# Patient Record
Sex: Female | Born: 1985 | Race: White | Hispanic: No | Marital: Married | Smoking: Current every day smoker
Health system: Southern US, Community
[De-identification: ages and names within clinical notes are randomized; demographics above are authoritative.]

## PROBLEM LIST (undated history)

## (undated) DIAGNOSIS — Z789 Other specified health status: Secondary | ICD-10-CM

## (undated) HISTORY — DX: Other specified health status: Z78.9

## (undated) HISTORY — PX: NO PAST SURGERIES: SHX2092

---

## 2010-05-14 ENCOUNTER — Inpatient Hospital Stay (HOSPITAL_COMMUNITY): Admission: AD | Admit: 2010-05-14 | Discharge: 2010-05-15 | Payer: Self-pay | Admitting: Obstetrics and Gynecology

## 2010-05-15 ENCOUNTER — Inpatient Hospital Stay (HOSPITAL_COMMUNITY): Admission: AD | Admit: 2010-05-15 | Discharge: 2010-05-15 | Payer: Self-pay | Admitting: Obstetrics and Gynecology

## 2010-05-20 ENCOUNTER — Inpatient Hospital Stay (HOSPITAL_COMMUNITY): Admission: AD | Admit: 2010-05-20 | Discharge: 2010-05-20 | Payer: Self-pay | Admitting: Obstetrics and Gynecology

## 2010-05-20 ENCOUNTER — Ambulatory Visit: Payer: Self-pay | Admitting: Obstetrics and Gynecology

## 2010-05-23 ENCOUNTER — Inpatient Hospital Stay (HOSPITAL_COMMUNITY): Admission: AD | Admit: 2010-05-23 | Discharge: 2010-05-23 | Payer: Self-pay | Admitting: Obstetrics and Gynecology

## 2010-06-05 ENCOUNTER — Inpatient Hospital Stay (HOSPITAL_COMMUNITY): Admission: RE | Admit: 2010-06-05 | Discharge: 2010-06-07 | Payer: Self-pay | Admitting: Obstetrics and Gynecology

## 2010-12-04 LAB — COMPREHENSIVE METABOLIC PANEL
ALT: 8 U/L (ref 0–35)
AST: 15 U/L (ref 0–37)
AST: 16 U/L (ref 0–37)
Alkaline Phosphatase: 133 U/L — ABNORMAL HIGH (ref 39–117)
Alkaline Phosphatase: 112 U/L (ref 39–117)
CO2: 20 mEq/L (ref 19–32)
CO2: 20 mEq/L (ref 19–32)
Calcium: 8.6 mg/dL (ref 8.4–10.5)
Chloride: 107 mEq/L (ref 96–112)
Creatinine, Ser: 0.5 mg/dL (ref 0.4–1.2)
GFR calc Af Amer: >60 mL/min (ref >60)
GFR calc non Af Amer: >60 mL/min (ref >60)
Glucose, Bld: 86 mg/dL (ref 70–99)
Potassium: 3.5 mEq/L (ref 3.5–5.1)
Sodium: 133 mEq/L — ABNORMAL LOW (ref 135–145)
Total Bilirubin: 0.6 mg/dL (ref 0.3–1.2)
Total Bilirubin: 0.5 mg/dL (ref 0.3–1.2)
Total Protein: 6 g/dL (ref 6.0–8.3)
Total Protein: 5.5 g/dL — ABNORMAL LOW (ref 6.0–8.3)

## 2010-12-04 LAB — CBC
HCT: 28.9 % — ABNORMAL LOW (ref 36.0–46.0)
HCT: 30.3 % — ABNORMAL LOW (ref 36.0–46.0)
Hemoglobin: 10.1 g/dL — ABNORMAL LOW (ref 12.0–15.0)
Hemoglobin: 10.1 g/dL — ABNORMAL LOW (ref 12.0–15.0)
MCH: 32.1 pg (ref 26.0–34.0)
MCH: 32.9 pg (ref 26.0–34.0)
MCV: 92.5 fL (ref 78.0–100.0)
MCV: 93.2 fL (ref 78.0–100.0)
RDW: 13 % (ref 11.5–15.5)
WBC: 16.5 10*3/uL — ABNORMAL HIGH (ref 4.0–10.5)
WBC: 8.9 10*3/uL (ref 4.0–10.5)

## 2010-12-04 LAB — URINALYSIS, ROUTINE W REFLEX MICROSCOPIC
Glucose, UA: NEGATIVE mg/dL
Protein, ur: NEGATIVE mg/dL
pH: 6.5 (ref 5.0–8.0)

## 2010-12-04 LAB — URINE MICROSCOPIC-ADD ON

## 2010-12-04 LAB — URIC ACID: Uric Acid, Serum: 5.3 mg/dL (ref 2.4–7.0)

## 2010-12-05 LAB — URINALYSIS, ROUTINE W REFLEX MICROSCOPIC
Glucose, UA: NEGATIVE mg/dL
Ketones, ur: NEGATIVE mg/dL
Nitrite: NEGATIVE
Protein, ur: NEGATIVE mg/dL
Specific Gravity, Urine: 1.02 (ref 1.005–1.030)
Urobilinogen, UA: 0.2 mg/dL (ref 0.0–1.0)

## 2015-06-10 ENCOUNTER — Encounter: Payer: Self-pay | Admitting: Women's Health

## 2015-06-10 ENCOUNTER — Ambulatory Visit (INDEPENDENT_AMBULATORY_CARE_PROVIDER_SITE_OTHER): Payer: BLUE CROSS/BLUE SHIELD | Admitting: Women's Health

## 2015-06-10 VITALS — Ht 59.0 in | Wt 150.0 lb

## 2015-06-10 DIAGNOSIS — Z30430 Encounter for insertion of intrauterine contraceptive device: Secondary | ICD-10-CM | POA: Diagnosis not present

## 2015-06-10 DIAGNOSIS — Z30432 Encounter for removal of intrauterine contraceptive device: Secondary | ICD-10-CM | POA: Diagnosis not present

## 2015-06-10 DIAGNOSIS — Z3009 Encounter for other general counseling and advice on contraception: Secondary | ICD-10-CM

## 2015-06-10 NOTE — Progress Notes (Signed)
Julie Morton is a 29 y.o. year old G58P2002 Caucasian female who presents for removal and replacement of a Mirena IUD. She was given informed consent for removal and reinsertion of her Mirena. Her Mirena was placed 06/2010 after the birth of her last child, Patient's last menstrual period was 05/20/2015.   The risks and benefits of the method and placement have been thouroughly reviewed with the patient and all questions were answered.  Specifically the patient is aware of failure rate of 09/998, expulsion of the IUD and of possible perforation.  The patient is aware of irregular bleeding due to the method and understands the incidence of irregular bleeding diminishes with time.  Signed copy of informed consent in chart.   Patient's last menstrual period was 05/20/2015. Ht  (1.499 m)  Wt 150 lb (68.04 kg)  BMI 30.28 kg/m2  LMP 05/20/2015 No results found for this or any previous visit (from the past 24 hour(s)).   Appropriate time out taken. A graves speculum was placed in the vagina.  The cervix was visualized, prepped using Betadine. The strings were visible. They were grasped and the Mirena was easily removed. The cervix was then grasped with a single-tooth tenaculum. The uterus was found to be neutral and it sounded to 7 cm. The dilators were easily passed through the cervical os, but the Mirena IUD was difficult to pass through the os, however it finally did and pt experienced a lot of pain, there was blood flashback in the IUD chamber and flange was at 8cm- so it was felt that the uterus was perforated, so IUD was not inserted and insertion device was withdrawn.   Discussed w/ pt- gave option of returning while on period for IUD insertion. Pt unsure she wants another IUD at this time. Discussed nexplanon as another LARC method, as well as all other contraception options. Pt wants to think about it and will let us know.  Recommended abstinence until new contraception initiated.     Marge Duncans CNM, [facility] 06/10/2015 11:06 AM

## 2015-06-10 NOTE — Patient Instructions (Signed)
Do not have sex until you decided what type of birth control you want

## 2015-06-12 ENCOUNTER — Telehealth: Payer: Self-pay | Admitting: *Deleted

## 2015-06-12 ENCOUNTER — Other Ambulatory Visit: Payer: Self-pay | Admitting: Women's Health

## 2015-06-12 MED ORDER — CIPROFLOXACIN HCL 500 MG PO TABS: 500.0000 mg | ORAL_TABLET | Freq: Two times a day (BID) | ORAL | Status: DC

## 2015-06-12 NOTE — Telephone Encounter (Signed)
Informed pt Julie Morton has sent in a medication to her pharmacy and wants to recheck her on Monday, informed if symptoms worsen to call back or go to ED.  Pt was not wanting to wait until next week at first, she was more concerned with her symptoms today and stated she was unsure if she even wanted to come back here at all after all the pain she had and states we scared her because she yelled for Kim to stop and she kept going.  Discussed with Dr. Despina Hidden and he said there is nothing to need check today, take the medication and recheck next week.  After several minute talking to the pt she agreed to return on Monday and call was transferred to front staff for an appointment to be made.  Pt also states that her husband has made an appointment to have a vasectomy so she is not interested in getting any form of birth control any more.

## 2015-06-12 NOTE — Progress Notes (Signed)
Pt called and spoke w/ nurse, nausea, cramping, fever/chills today. Had IUD removal and unsuccessful reinsertion/possible uterine perforation on Monday 9/19. Discussed w/ [facility], doesn't need to be seen today, rx cipro  BID x 10d, come in Monday for f/u. To let us know/go to ED for worsening sx.  Cheral Marker, CNM, [facility] 06/12/2015 2:46 PM

## 2015-06-12 NOTE — Telephone Encounter (Signed)
Pt states was here on Monday for IUD removal and reinsertion, removal was successful but the reinsertion was not.  Pt states she was told she would bleed for a couple of days but her bleeding is worse today than it has been and she is having lower abdominal cramping, nausea and fever off and on all day today.  She is concerned that this could be related to the trouble getting a new IUD placed.  Please advise.

## 2015-06-14 ENCOUNTER — Telehealth: Payer: Self-pay | Admitting: Women's Health

## 2015-06-14 NOTE — Telephone Encounter (Signed)
Called to check on pt. States she is feeling much better. Did not go get antibiotics d/t working, etc. No longer having any fever/chills, cramping, vb. Does not feel that she needs to keep appt on Monday for f/u. To call us if she needs anything.  Cheral Marker, CNM, [facility] 06/14/2015 1:03 PM

## 2015-06-17 ENCOUNTER — Ambulatory Visit: Payer: BLUE CROSS/BLUE SHIELD | Admitting: Women's Health

## 2015-10-25 ENCOUNTER — Encounter: Payer: Self-pay | Admitting: Family Medicine

## 2015-10-25 ENCOUNTER — Ambulatory Visit (INDEPENDENT_AMBULATORY_CARE_PROVIDER_SITE_OTHER): Payer: BLUE CROSS/BLUE SHIELD | Admitting: Family Medicine

## 2015-10-25 VITALS — BP 117/84 | HR 107 | Temp 98.3°F | Ht 59.0 in | Wt 154.4 lb

## 2015-10-25 DIAGNOSIS — J029 Acute pharyngitis, unspecified: Secondary | ICD-10-CM | POA: Diagnosis not present

## 2015-10-25 LAB — POCT RAPID STREP A (OFFICE): RAPID STREP A SCREEN: NEGATIVE

## 2015-10-25 NOTE — Progress Notes (Signed)
   Subjective:    Patient ID: Julie Morton, female    DOB: March 27, 1986, 30 y.o.   MRN: [id number]  HPI 30 year old female who is a new patient to the practice. Her complaints are sore throat and left ear pain. She denies fever. She has had strep in the past. Symptoms started yesterday.  There are no active problems to display for this patient.  Outpatient Encounter Prescriptions as of 10/25/2015  Medication Sig  . [DISCONTINUED] ciprofloxacin (CIPRO) 500 MG tablet Take 1 tablet (500 mg total) by mouth 2 (two) times daily. X 10days  . [DISCONTINUED] levonorgestrel (MIRENA) 20 MCG/24HR IUD 1 each by Intrauterine route once.   No facility-administered encounter medications on file as of 10/25/2015.      Review of Systems  Constitutional: Negative.   HENT: Positive for ear pain and sore throat.   Respiratory: Negative.   Cardiovascular: Negative.        Objective:   Physical Exam  Constitutional: She appears well-developed and well-nourished.  HENT:  Mouth/Throat: Oropharynx is clear and moist. No oropharyngeal exudate.  Left TM is slightly dull compared to right  Pulmonary/Chest: Effort normal.          Assessment & Plan:  1. Sore throat Rapid strep test is negative. Clinical exam would support this. There is no significant adenopathy no fever no headache. The patient was told this is likely a virus she seemed to not want to accept this because I told her treatment is symptomatic with anti-inflammatory salt water gargles. She asked about contagious illness and we discussed this several times. For her ear I would recommended Valsalva maneuver (ear exercise) and a 2 or 3 day course of Afrin twice a day.  Frederica Kuster MD - POCT rapid strep A

## 2016-06-04 LAB — OB RESULTS CONSOLE HIV ANTIBODY (ROUTINE TESTING): HIV: NONREACTIVE

## 2016-06-04 LAB — OB RESULTS CONSOLE RPR: RPR: NONREACTIVE

## 2016-06-04 LAB — OB RESULTS CONSOLE RUBELLA ANTIBODY, IGM: RUBELLA: NON-IMMUNE/NOT IMMUNE

## 2016-06-04 LAB — OB RESULTS CONSOLE ABO/RH: RH TYPE: POSITIVE

## 2016-06-04 LAB — OB RESULTS CONSOLE ANTIBODY SCREEN: ANTIBODY SCREEN: NEGATIVE

## 2016-06-04 LAB — OB RESULTS CONSOLE GC/CHLAMYDIA
Chlamydia: NEGATIVE
Gonorrhea: NEGATIVE

## 2016-06-04 LAB — OB RESULTS CONSOLE HEPATITIS B SURFACE ANTIGEN: HEP B S AG: NEGATIVE

## 2016-06-15 ENCOUNTER — Emergency Department (HOSPITAL_COMMUNITY)
Admission: EM | Admit: 2016-06-15 | Discharge: 2016-06-15 | Disposition: A | Discharge status: Home / Self Care | Payer: BLUE CROSS/BLUE SHIELD | Attending: Emergency Medicine | Admitting: Emergency Medicine

## 2016-06-15 ENCOUNTER — Encounter (HOSPITAL_COMMUNITY): Payer: Self-pay | Admitting: *Deleted

## 2016-06-15 DIAGNOSIS — R51 Headache: Secondary | ICD-10-CM | POA: Diagnosis not present

## 2016-06-15 DIAGNOSIS — O99511 Diseases of the respiratory system complicating pregnancy, first trimester: Secondary | ICD-10-CM | POA: Diagnosis present

## 2016-06-15 DIAGNOSIS — F17210 Nicotine dependence, cigarettes, uncomplicated: Secondary | ICD-10-CM | POA: Diagnosis not present

## 2016-06-15 DIAGNOSIS — Z79899 Other long term (current) drug therapy: Secondary | ICD-10-CM | POA: Insufficient documentation

## 2016-06-15 DIAGNOSIS — O99331 Smoking (tobacco) complicating pregnancy, first trimester: Secondary | ICD-10-CM | POA: Diagnosis not present

## 2016-06-15 DIAGNOSIS — Z3A11 11 weeks gestation of pregnancy: Secondary | ICD-10-CM | POA: Insufficient documentation

## 2016-06-15 DIAGNOSIS — J40 Bronchitis, not specified as acute or chronic: Secondary | ICD-10-CM | POA: Diagnosis not present

## 2016-06-15 DIAGNOSIS — R109 Unspecified abdominal pain: Secondary | ICD-10-CM | POA: Diagnosis not present

## 2016-06-15 MED ORDER — ACETAMINOPHEN 500 MG PO TABS
1000.0000 mg | ORAL_TABLET | Freq: Once | ORAL | Status: AC
  Administered 2016-06-15: 1000 mg via ORAL
  Filled 2016-06-15: qty 2

## 2016-06-15 MED ORDER — AZITHROMYCIN 250 MG PO TABS: 250.0000 mg | ORAL_TABLET | Freq: Every day | ORAL | 0 refills | Status: DC

## 2016-06-15 MED ORDER — ALBUTEROL SULFATE HFA 108 (90 BASE) MCG/ACT IN AERS
2.0000 | INHALATION_SPRAY | RESPIRATORY_TRACT | Status: DC | PRN
  Administered 2016-06-15: 2 via RESPIRATORY_TRACT
  Filled 2016-06-15: qty 6.7

## 2016-06-15 NOTE — ED Notes (Signed)
MD at bedside. 

## 2016-06-15 NOTE — ED Provider Notes (Signed)
[facility]-EMERGENCY DEPT Provider Note   CSN: [id number] Arrival date & time: 06/15/16  2108  By signing my name below, I, Alyssa GroveMartin Green, attest that this documentation has been prepared under the direction and in the presence of Azalia BilisKevin Lorianna Spadaccini, MD. Electronically Signed: Alyssa GroveMartin Green, ED Scribe. 06/15/16. 11:29 PM.   History   Chief Complaint Chief Complaint  Patient presents with  . URI   The history is provided by the patient. No language interpreter was used.    HPI Comments: Julie Morton is a 30 y.o. female who presents to the Emergency Department complaining of gradual onset, constant, nonproductive cough onset 6 days PTA. She is currently [redacted] weeks pregnant and has had US to confirm. She states this is her 4th child. Pt reports associated nasal congestion, chest congestion, chest tightness, fevers, and headaches. She states she experiences left sided abdominal pain pain when she coughs. Pt is a smoker, but has she has been tapering off due to pregnancy. Denies shortness of breath. Pt is allergic to Amoxicillin and Penicillin.   History reviewed. No pertinent past medical history.  There are no active problems to display for this patient.   History reviewed. No pertinent surgical history.  OB History    Gravida Para Term Preterm AB Living   3 2 2     2    SAB TAB Ectopic Multiple Live Births           2       Home Medications    Prior to Admission medications   Not on File    Family History Family History  Problem Relation Age of Onset  . Diabetes Father     Social History Social History  Substance Use Topics  . Smoking status: Current Every Day Smoker    Packs/day: 0.50    Years: 10.00    Types: Cigarettes  . Smokeless tobacco: Never Used  . Alcohol use No     Allergies   Amoxicillin and Penicillins   Review of Systems Review of Systems A complete 10 system review of systems was obtained and all systems are negative except as noted in the HPI and PMH.      Physical Exam Updated Vital Signs BP 156/98 (BP Location: Left Arm)   Pulse 102   Temp 98.6 F (37 C) (Oral)   Resp 18   Ht 4\' 11"  (1.499 m)   Wt 157 lb (71.2 kg)   LMP 03/29/2016   SpO2 99%   BMI 31.71 kg/m   Physical Exam  Constitutional: She is oriented to person, place, and time. She appears well-developed and well-nourished. No distress.  HENT:  Head: Normocephalic and atraumatic.  Eyes: EOM are normal.  Neck: Normal range of motion.  Cardiovascular: Normal rate, regular rhythm and normal heart sounds.   Pulmonary/Chest: Effort normal and breath sounds normal.  Abdominal: Soft. She exhibits no distension. There is no tenderness.  Musculoskeletal: Normal range of motion.  Neurological: She is alert and oriented to person, place, and time.  Skin: Skin is warm and dry.  Psychiatric: She has a normal mood and affect. Judgment normal.  Nursing note and vitals reviewed.   ED Treatments / Results  DIAGNOSTIC STUDIES: Oxygen Saturation is 99% on RA, normal by my interpretation.    COORDINATION OF CARE: 11:26 PM Discussed treatment plan with pt at bedside which includes Albuterol and Azithromycin and pt agreed to plan.  Labs (all labs ordered are listed, but only abnormal results are displayed) Labs Reviewed -  No data to display  EKG  EKG Interpretation None       Radiology No results found.  Procedures Procedures (including critical care time)  Medications Ordered in ED Medications - No data to display   Initial Impression / Assessment and Plan / ED Course  I have reviewed the triage vital signs and the nursing notes.  Pertinent labs & imaging results that were available during my care of the patient were reviewed by me and considered in my medical decision making (see chart for details).  Clinical Course   I personally performed the services described in this documentation, which was scribed in my presence. The recorded information has been  reviewed and is accurate.       Pt is well appearing. Suspect bronchitis. Will cover with azithromycin. Home with albuterol to help with cough. No hypoxia or shortness of breath  Final Clinical Impressions(s) / ED Diagnoses   Final diagnoses:  Bronchitis    New Prescriptions New Prescriptions   No medications on file     Azalia Bilis, MD 06/15/16 2337

## 2016-06-15 NOTE — ED Triage Notes (Signed)
Pt reports nasal congestion, chest congestion. Pain on the left torso when she coughs. Intermittent fevers. Symptoms for 6 days. Pt reports [redacted] weeks pregnant.

## 2016-06-18 ENCOUNTER — Ambulatory Visit (INDEPENDENT_AMBULATORY_CARE_PROVIDER_SITE_OTHER): Payer: BLUE CROSS/BLUE SHIELD | Admitting: Family Medicine

## 2016-06-18 ENCOUNTER — Encounter: Payer: Self-pay | Admitting: Family Medicine

## 2016-06-18 VITALS — BP 111/81 | HR 97 | Temp 98.0°F | Ht 59.0 in | Wt 156.0 lb

## 2016-06-18 DIAGNOSIS — J209 Acute bronchitis, unspecified: Secondary | ICD-10-CM | POA: Diagnosis not present

## 2016-06-18 MED ORDER — ALBUTEROL SULFATE (2.5 MG/3ML) 0.083% IN NEBU: 2.5000 mg | INHALATION_SOLUTION | Freq: Four times a day (QID) | RESPIRATORY_TRACT | 0 refills | Status: DC | PRN

## 2016-06-18 MED ORDER — ALBUTEROL SULFATE (2.5 MG/3ML) 0.083% IN NEBU
2.5000 mg | INHALATION_SOLUTION | RESPIRATORY_TRACT | Status: AC
  Administered 2016-06-18: 2.5 mg via RESPIRATORY_TRACT

## 2016-06-18 NOTE — Progress Notes (Signed)
   HPI  Patient presents today with persistent cough.  Patient explains that she has been sick for about 8 days now, she went to the emergency room last weekend and was treated for bronchitis with azithromycin and albuterol. She states that albuterol is not very helpful, she only feels marginally improved.  She [redacted] weeks pregnant.  She has penicillin and amoxicillin allergy.  She's tolerating food and fluids normally. She also has malaise.  PMH: Smoking status noted ROS: Per HPI  Objective: BP 111/81   Pulse 97   Temp 98 F (36.7 C) (Oral)   Ht 4\' 11"  (1.499 m)   Wt 156 lb (70.8 kg)   LMP 03/29/2016   BMI 31.51 kg/m  Gen: NAD, alert, cooperative with exam HEENT: NCAT, nares with erythema and swelling, oropharynx clear, TMs normal bilaterally CV: RRR, good S1/S2, no murmur Resp: CTABL, no wheezes, non-labored Ext: No edema, warm Neuro: Alert and oriented, No gross deficits  Assessment and plan:  # acute bronchitis Continue azithromycin Given the overall nebulizer in clinic with very good improvement of symptoms Prescribed nebulizer machine   Discussed risks/Benefits with albuterol and pregnancy.  Meds ordered this encounter  Medications  . DISCONTD: albuterol (PROVENTIL HFA;VENTOLIN HFA) 108 (90 Base) MCG/ACT inhaler    Sig: Inhale 2 puffs into the lungs every 6 (six) hours as needed for wheezing or shortness of breath.  Marland Kitchen. albuterol (PROVENTIL) (2.5 MG/3ML) 0.083% nebulizer solution 2.5 mg  . albuterol (PROVENTIL) (2.5 MG/3ML) 0.083% nebulizer solution    Sig: Take 3 mLs (2.5 mg total) by nebulization every 6 (six) hours as needed for wheezing or shortness of breath.    Dispense:  150 mL    Refill:  0    Murtis SinkSam Bradshaw, MD [facility] 06/18/2016, 5:48 PM

## 2016-06-18 NOTE — Patient Instructions (Addendum)
Great to meet you!  Use the albuterol as needed ( up to 4 times daily) for the next week or so   Acute Bronchitis Bronchitis is when the airways that extend from the windpipe into the lungs get red, puffy, and painful (inflamed). Bronchitis often causes thick spit (mucus) to develop. This leads to a cough. A cough is the most common symptom of bronchitis. In acute bronchitis, the condition usually begins suddenly and goes away over time (usually in 2 weeks). Smoking, allergies, and asthma can make bronchitis worse. Repeated episodes of bronchitis may cause more lung problems. HOME CARE  Rest.  Drink enough fluids to keep your pee (urine) clear or pale yellow (unless you need to limit fluids as told by your doctor).  Only take over-the-counter or prescription medicines as told by your doctor.  Avoid smoking and secondhand smoke. These can make bronchitis worse. If you are a smoker, think about using nicotine gum or skin patches. Quitting smoking will help your lungs heal faster.  Reduce the chance of getting bronchitis again by:  Washing your hands often.  Avoiding people with cold symptoms.  Trying not to touch your hands to your mouth, nose, or eyes.  Follow up with your doctor as told. GET HELP IF: Your symptoms do not improve after 1 week of treatment. Symptoms include:  Cough.  Fever.  Coughing up thick spit.  Body aches.  Chest congestion.  Chills.  Shortness of breath.  Sore throat. GET HELP RIGHT AWAY IF:   You have an increased fever.  You have chills.  You have severe shortness of breath.  You have bloody thick spit (sputum).  You throw up (vomit) often.  You lose too much body fluid (dehydration).  You have a severe headache.  You faint. MAKE SURE YOU:   Understand these instructions.  Will watch your condition.  Will get help right away if you are not doing well or get worse.   This information is not intended to replace advice given to  you by your health care provider. Make sure you discuss any questions you have with your health care provider.   Document Released: 02/24/2008 Document Revised: 05/10/2013 Document Reviewed: 02/28/2013 Elsevier Interactive Patient Education [employer]2016 Elsevier Inc.

## 2016-08-24 ENCOUNTER — Ambulatory Visit (INDEPENDENT_AMBULATORY_CARE_PROVIDER_SITE_OTHER): Payer: BLUE CROSS/BLUE SHIELD | Admitting: Physician Assistant

## 2016-08-24 ENCOUNTER — Encounter: Payer: Self-pay | Admitting: Physician Assistant

## 2016-08-24 VITALS — BP 120/79 | HR 105 | Temp 97.9°F | Ht 59.0 in | Wt 166.0 lb

## 2016-08-24 DIAGNOSIS — J209 Acute bronchitis, unspecified: Secondary | ICD-10-CM | POA: Diagnosis not present

## 2016-08-24 DIAGNOSIS — R059 Cough, unspecified: Secondary | ICD-10-CM

## 2016-08-24 DIAGNOSIS — R05 Cough: Secondary | ICD-10-CM | POA: Diagnosis not present

## 2016-08-24 MED ORDER — AZITHROMYCIN 250 MG PO TABS: 250.0000 mg | ORAL_TABLET | Freq: Every day | ORAL | 0 refills | Status: DC

## 2016-08-24 MED ORDER — AZITHROMYCIN 250 MG PO TABS
250.0000 mg | ORAL_TABLET | Freq: Every day | ORAL | 0 refills | Status: DC
Start: 2016-08-24 — End: 2016-12-27

## 2016-08-24 NOTE — Progress Notes (Signed)
BP 120/79 (BP Location: Right Arm, Patient Position: Sitting, Cuff Size: Normal)   Pulse (!) 105   Temp 97.9 F (36.6 C) (Oral)   Ht 4\' 11"  (1.499 m)   Wt 166 lb (75.3 kg)   LMP 03/29/2016   BMI 33.53 kg/m    Subjective:    Patient ID: Julie Morton, female    DOB: 1986/04/29, 30 y.o.   MRN: [id number]  HPI: Julie Morton is a 30 y.o. female presenting on 08/24/2016 for Fever (up to 102. Has taken Tylenol); Nasal Congestion; Otalgia (left ear pain); and Cough (symptoms x 2 days. Has taken dimetapp)  Patient is [redacted] weeks pregnant. She has had an uneventful pregnancy. She has had one other bronchitis this pregnancy. She has tried some minimal medications for this. She is having some cough and wheezing at this point. She states that she will cough up congestion that is green in color. She denies any blood at this time.  Relevant past medical, surgical, family and social history reviewed and updated as indicated. Allergies and medications reviewed and updated.  No past medical history on file.  No past surgical history on file.  Review of Systems  Constitutional: Positive for fatigue and fever.  HENT: Positive for congestion, postnasal drip, sinus pain and sore throat. Negative for ear discharge and ear pain.   Eyes: Negative.   Respiratory: Positive for cough and wheezing.   Gastrointestinal: Negative.   Genitourinary: Negative.       Medication List       Accurate as of 08/24/16 11:12 AM. Always use your most recent med list.          albuterol (2.5 MG/3ML) 0.083% nebulizer solution Commonly known as:  PROVENTIL Take 3 mLs (2.5 mg total) by nebulization every 6 (six) hours as needed for wheezing or shortness of breath.   azithromycin 250 MG tablet Commonly known as:  ZITHROMAX Take 1 tablet (250 mg total) by mouth daily. Take first 2 tablets together, then 1 every day until finished.   metoCLOPramide 10 MG tablet Commonly known as:  REGLAN Take 1 tablet by mouth  every 8 (eight) hours as needed for nausea.          Objective:    BP 120/79 (BP Location: Right Arm, Patient Position: Sitting, Cuff Size: Normal)   Pulse (!) 105   Temp 97.9 F (36.6 C) (Oral)   Ht 4\' 11"  (1.499 m)   Wt 166 lb (75.3 kg)   LMP 03/29/2016   BMI 33.53 kg/m   Allergies  Allergen Reactions  . Amoxicillin Hives    Has patient had a PCN reaction causing immediate rash, facial/tongue/throat swelling, SOB or lightheadedness with hypotension: Yes Has patient had a PCN reaction causing severe rash involving mucus membranes or skin necrosis: No Has patient had a PCN reaction that required hospitalization No Has patient had a PCN reaction occurring within the last 10 years: Yes If all of the above answers are "NO", then may proceed with Cephalosporin use.   Marland Kitchen. Penicillins Hives    Physical Exam  Constitutional: She is oriented to person, place, and time. She appears well-developed and well-nourished.  HENT:  Head: Normocephalic and atraumatic.  Right Ear: There is drainage and tenderness.  Left Ear: There is drainage and tenderness.  Nose: Mucosal edema and rhinorrhea present. Right sinus exhibits maxillary sinus tenderness and frontal sinus tenderness. Left sinus exhibits maxillary sinus tenderness and frontal sinus tenderness.  Mouth/Throat: Oropharyngeal exudate and posterior oropharyngeal  erythema present.  Eyes: Conjunctivae and EOM are normal. Pupils are equal, round, and reactive to light.  Neck: Normal range of motion. Neck supple.  Cardiovascular: Normal rate, regular rhythm, normal heart sounds and intact distal pulses.   Pulmonary/Chest: Effort normal. She has wheezes in the right upper field and the left upper field.  Abdominal: Soft. Bowel sounds are normal.  Neurological: She is alert and oriented to person, place, and time. She has normal reflexes.  Skin: Skin is warm and dry. No rash noted.  Psychiatric: She has a normal mood and affect. Her behavior  is normal. Judgment and thought content normal.    Results for orders placed or performed in visit on 10/25/15  POCT rapid strep A  Result Value Ref Range   Rapid Strep A Screen Negative Negative      Assessment & Plan:   1. Acute bronchitis, unspecified organism - azithromycin (ZITHROMAX) 250 MG tablet; Take 1 tablet (250 mg total) by mouth daily. Take first 2 tablets together, then 1 every day until finished.  Dispense: 6 tablet; Refill: 0  2. Cough - azithromycin (ZITHROMAX) 250 MG tablet; Take 1 tablet (250 mg total) by mouth daily. Take first 2 tablets together, then 1 every day until finished.  Dispense: 6 tablet; Refill: 0   Continue all other maintenance medications as listed above.  Follow up plan: Return if symptoms worsen or fail to improve.  No orders of the defined types were placed in this encounter.   Educational handout given for uri/cough  Remus LofflerAngel S. Azion Centrella PA-C [facility] [address]  [place], [place] [phone]   08/24/2016, 11:12 AM

## 2016-08-24 NOTE — Patient Instructions (Signed)
-   Take meds as prescribed - Use a cool mist humidifier  -Use saline nose sprays frequently -Saline irrigations of the nose can be very helpful if done frequently.  * 4X daily for 1 week*  * Use of a nettie pot can be helpful with this. Follow directions with this* -Force fluids -For any cough or congestion DELSYM DIMETAPP   -For fever or aces or pains- take tylenol  appropriate for age and weight.  * for fevers greater than 101 orally you may alternate ibuprofen and tylenol every  3 hours. -Throat lozenges if help -New toothbrush in 3 days

## 2016-09-21 NOTE — L&D Delivery Note (Signed)
Delivery Note At 3:48 PM a viable female was delivered via Vaginal, Spontaneous Delivery (Presentation: vtx; LOA ).  APGAR: 9, 9; weight pending.   Placenta status: spontaneous, intact.  Cord:  with the following complications: none.   Anesthesia:  Epidural Episiotomy: None Lacerations: 1st degree Suture Repair: none Est. Blood Loss (mL): 200  Mom to postpartum.  Baby to Couplet care / Skin to Skin.  Julie Morton Julie Morton 12/27/2016, 4:00 PM

## 2016-10-06 DIAGNOSIS — R002 Palpitations: Secondary | ICD-10-CM | POA: Insufficient documentation

## 2016-12-09 LAB — OB RESULTS CONSOLE GBS: GBS: NEGATIVE

## 2016-12-21 ENCOUNTER — Telehealth (HOSPITAL_COMMUNITY): Payer: Self-pay | Admitting: *Deleted

## 2016-12-21 ENCOUNTER — Encounter (HOSPITAL_COMMUNITY): Payer: Self-pay | Admitting: *Deleted

## 2016-12-21 NOTE — Telephone Encounter (Signed)
Preadmission screen  

## 2016-12-27 ENCOUNTER — Inpatient Hospital Stay (HOSPITAL_COMMUNITY)
Admission: RE | Admit: 2016-12-27 | Discharge: 2016-12-28 | DRG: 775 | Disposition: A | Discharge status: Home / Self Care | Payer: BLUE CROSS/BLUE SHIELD | Source: Ambulatory Visit | Attending: Obstetrics and Gynecology | Admitting: Obstetrics and Gynecology

## 2016-12-27 ENCOUNTER — Inpatient Hospital Stay (HOSPITAL_COMMUNITY): Payer: BLUE CROSS/BLUE SHIELD | Admitting: Anesthesiology

## 2016-12-27 ENCOUNTER — Encounter (HOSPITAL_COMMUNITY): Payer: Self-pay

## 2016-12-27 DIAGNOSIS — F17210 Nicotine dependence, cigarettes, uncomplicated: Secondary | ICD-10-CM | POA: Diagnosis present

## 2016-12-27 DIAGNOSIS — Z3A39 39 weeks gestation of pregnancy: Secondary | ICD-10-CM | POA: Diagnosis not present

## 2016-12-27 DIAGNOSIS — O99334 Smoking (tobacco) complicating childbirth: Principal | ICD-10-CM | POA: Diagnosis present

## 2016-12-27 DIAGNOSIS — Z3493 Encounter for supervision of normal pregnancy, unspecified, third trimester: Secondary | ICD-10-CM | POA: Diagnosis present

## 2016-12-27 LAB — CBC
HEMATOCRIT: 30.6 % — AB (ref 36.0–46.0)
Hemoglobin: 10.5 g/dL — ABNORMAL LOW (ref 12.0–15.0)
MCH: 30 pg (ref 26.0–34.0)
MCHC: 34.3 g/dL (ref 30.0–36.0)
MCV: 87.4 fL (ref 78.0–100.0)
Platelets: 314 10*3/uL (ref 150–400)
RBC: 3.5 MIL/uL — AB (ref 3.87–5.11)
RDW: 14.2 % (ref 11.5–15.5)
WBC: 10.4 10*3/uL (ref 4.0–10.5)

## 2016-12-27 LAB — TYPE AND SCREEN
ABO/RH(D): O POS
Antibody Screen: NEGATIVE

## 2016-12-27 LAB — ABO/RH: ABO/RH(D): O POS

## 2016-12-27 LAB — RPR: RPR Ser Ql: NONREACTIVE

## 2016-12-27 MED ORDER — PHENYLEPHRINE 40 MCG/ML (10ML) SYRINGE FOR IV PUSH (FOR BLOOD PRESSURE SUPPORT)
80.0000 ug | PREFILLED_SYRINGE | INTRAVENOUS | Status: DC | PRN
Start: 2016-12-27 — End: 2016-12-27

## 2016-12-27 MED ORDER — TERBUTALINE SULFATE 1 MG/ML IJ SOLN: 0.2500 mg | Freq: Once | INTRAMUSCULAR | Status: DC | PRN

## 2016-12-27 MED ORDER — ZOLPIDEM TARTRATE 5 MG PO TABS: 5.0000 mg | ORAL_TABLET | Freq: Every evening | ORAL | Status: DC | PRN

## 2016-12-27 MED ORDER — ACETAMINOPHEN 325 MG PO TABS: 650.0000 mg | ORAL_TABLET | ORAL | Status: DC | PRN

## 2016-12-27 MED ORDER — IBUPROFEN 600 MG PO TABS
600.0000 mg | ORAL_TABLET | Freq: Four times a day (QID) | ORAL | Status: DC
  Administered 2016-12-27 – 2016-12-28 (×4): 600 mg via ORAL
  Filled 2016-12-27 (×4): qty 1

## 2016-12-27 MED ORDER — PHENYLEPHRINE 40 MCG/ML (10ML) SYRINGE FOR IV PUSH (FOR BLOOD PRESSURE SUPPORT): 80.0000 ug | PREFILLED_SYRINGE | INTRAVENOUS | Status: DC | PRN

## 2016-12-27 MED ORDER — ONDANSETRON HCL 4 MG/2ML IJ SOLN: 4.0000 mg | INTRAMUSCULAR | Status: DC | PRN

## 2016-12-27 MED ORDER — EPHEDRINE 5 MG/ML INJ: 10.0000 mg | INTRAVENOUS | Status: DC | PRN

## 2016-12-27 MED ORDER — LACTATED RINGERS IV SOLN
INTRAVENOUS | Status: DC
  Administered 2016-12-27: 07:00:00 via INTRAVENOUS

## 2016-12-27 MED ORDER — OXYCODONE HCL 5 MG PO TABS: 5.0000 mg | ORAL_TABLET | ORAL | Status: DC | PRN

## 2016-12-27 MED ORDER — FENTANYL 2.5 MCG/ML BUPIVACAINE 1/10 % EPIDURAL INFUSION (WH - ANES)
14.0000 mL/h | INTRAMUSCULAR | Status: DC | PRN
  Administered 2016-12-27: 14 mL/h via EPIDURAL

## 2016-12-27 MED ORDER — OXYCODONE-ACETAMINOPHEN 5-325 MG PO TABS: 1.0000 | ORAL_TABLET | ORAL | Status: DC | PRN

## 2016-12-27 MED ORDER — FENTANYL 2.5 MCG/ML BUPIVACAINE 1/10 % EPIDURAL INFUSION (WH - ANES): 14.0000 mL/h | INTRAMUSCULAR | Status: DC | PRN

## 2016-12-27 MED ORDER — FENTANYL 2.5 MCG/ML BUPIVACAINE 1/10 % EPIDURAL INFUSION (WH - ANES)
INTRAMUSCULAR | Status: AC
  Filled 2016-12-27: qty 100

## 2016-12-27 MED ORDER — DIBUCAINE 1 % RE OINT: 1.0000 "application " | TOPICAL_OINTMENT | RECTAL | Status: DC | PRN

## 2016-12-27 MED ORDER — OXYTOCIN BOLUS FROM INFUSION
500.0000 mL | Freq: Once | INTRAVENOUS | Status: DC
  Administered 2016-12-27: 500 mL via INTRAVENOUS

## 2016-12-27 MED ORDER — OXYCODONE-ACETAMINOPHEN 5-325 MG PO TABS: 2.0000 | ORAL_TABLET | ORAL | Status: DC | PRN

## 2016-12-27 MED ORDER — DIPHENHYDRAMINE HCL 25 MG PO CAPS: 25.0000 mg | ORAL_CAPSULE | Freq: Four times a day (QID) | ORAL | Status: DC | PRN

## 2016-12-27 MED ORDER — METHYLERGONOVINE MALEATE 0.2 MG/ML IJ SOLN: 0.2000 mg | INTRAMUSCULAR | Status: DC | PRN

## 2016-12-27 MED ORDER — METHYLERGONOVINE MALEATE 0.2 MG PO TABS: 0.2000 mg | ORAL_TABLET | ORAL | Status: DC | PRN

## 2016-12-27 MED ORDER — SIMETHICONE 80 MG PO CHEW: 80.0000 mg | CHEWABLE_TABLET | ORAL | Status: DC | PRN

## 2016-12-27 MED ORDER — OXYCODONE HCL 5 MG PO TABS: 10.0000 mg | ORAL_TABLET | ORAL | Status: DC | PRN

## 2016-12-27 MED ORDER — OXYTOCIN 40 UNITS IN LACTATED RINGERS INFUSION - SIMPLE MED
2.5000 [IU]/h | INTRAVENOUS | Status: DC
  Filled 2016-12-27: qty 1000

## 2016-12-27 MED ORDER — COCONUT OIL OIL: 1.0000 "application " | TOPICAL_OIL | Status: DC | PRN

## 2016-12-27 MED ORDER — DIPHENHYDRAMINE HCL 50 MG/ML IJ SOLN: 12.5000 mg | INTRAMUSCULAR | Status: DC | PRN

## 2016-12-27 MED ORDER — WITCH HAZEL-GLYCERIN EX PADS: 1.0000 "application " | MEDICATED_PAD | CUTANEOUS | Status: DC | PRN

## 2016-12-27 MED ORDER — LACTATED RINGERS IV SOLN: 500.0000 mL | INTRAVENOUS | Status: DC | PRN

## 2016-12-27 MED ORDER — PRENATAL MULTIVITAMIN CH
1.0000 | ORAL_TABLET | Freq: Every day | ORAL | Status: DC
  Filled 2016-12-27: qty 1

## 2016-12-27 MED ORDER — MAGNESIUM HYDROXIDE 400 MG/5ML PO SUSP: 30.0000 mL | ORAL | Status: DC | PRN

## 2016-12-27 MED ORDER — ONDANSETRON HCL 4 MG/2ML IJ SOLN
4.0000 mg | Freq: Four times a day (QID) | INTRAMUSCULAR | Status: DC | PRN
  Administered 2016-12-27: 4 mg via INTRAVENOUS
  Filled 2016-12-27: qty 2

## 2016-12-27 MED ORDER — TETANUS-DIPHTH-ACELL PERTUSSIS 5-2.5-18.5 LF-MCG/0.5 IM SUSP
0.5000 mL | Freq: Once | INTRAMUSCULAR | Status: DC
  Filled 2016-12-27: qty 0.5

## 2016-12-27 MED ORDER — BENZOCAINE-MENTHOL 20-0.5 % EX AERO
1.0000 "application " | INHALATION_SPRAY | CUTANEOUS | Status: DC | PRN
  Administered 2016-12-28: 1 via TOPICAL
  Filled 2016-12-27: qty 56

## 2016-12-27 MED ORDER — LACTATED RINGERS IV SOLN: 500.0000 mL | Freq: Once | INTRAVENOUS | Status: DC

## 2016-12-27 MED ORDER — SENNOSIDES-DOCUSATE SODIUM 8.6-50 MG PO TABS
2.0000 | ORAL_TABLET | ORAL | Status: DC
  Administered 2016-12-27: 2 via ORAL
  Filled 2016-12-27: qty 2

## 2016-12-27 MED ORDER — MEASLES, MUMPS & RUBELLA VAC ~~LOC~~ INJ
0.5000 mL | INJECTION | Freq: Once | SUBCUTANEOUS | Status: AC
  Administered 2016-12-28: 0.5 mL via SUBCUTANEOUS
  Filled 2016-12-27 (×2): qty 0.5

## 2016-12-27 MED ORDER — LIDOCAINE HCL (PF) 1 % IJ SOLN
INTRAMUSCULAR | Status: DC | PRN
  Administered 2016-12-27: 5 mL via EPIDURAL

## 2016-12-27 MED ORDER — LORATADINE 10 MG PO TABS: 10.0000 mg | ORAL_TABLET | Freq: Every day | ORAL | Status: DC | PRN

## 2016-12-27 MED ORDER — BUTORPHANOL TARTRATE 1 MG/ML IJ SOLN: 1.0000 mg | INTRAMUSCULAR | Status: DC | PRN

## 2016-12-27 MED ORDER — SOD CITRATE-CITRIC ACID 500-334 MG/5ML PO SOLN: 30.0000 mL | ORAL | Status: DC | PRN

## 2016-12-27 MED ORDER — OXYTOCIN 40 UNITS IN LACTATED RINGERS INFUSION - SIMPLE MED
1.0000 m[IU]/min | INTRAVENOUS | Status: DC
  Administered 2016-12-27: 2 m[IU]/min via INTRAVENOUS

## 2016-12-27 MED ORDER — LIDOCAINE HCL (PF) 1 % IJ SOLN
30.0000 mL | INTRAMUSCULAR | Status: DC | PRN
  Filled 2016-12-27 (×2): qty 30

## 2016-12-27 MED ORDER — ONDANSETRON HCL 4 MG PO TABS: 4.0000 mg | ORAL_TABLET | ORAL | Status: DC | PRN

## 2016-12-27 MED ORDER — EPHEDRINE 5 MG/ML INJ
INTRAVENOUS | Status: AC
  Filled 2016-12-27: qty 4

## 2016-12-27 MED ORDER — PHENYLEPHRINE 40 MCG/ML (10ML) SYRINGE FOR IV PUSH (FOR BLOOD PRESSURE SUPPORT)
PREFILLED_SYRINGE | INTRAVENOUS | Status: AC
  Filled 2016-12-27: qty 20

## 2016-12-27 MED ORDER — PNEUMOCOCCAL VAC POLYVALENT 25 MCG/0.5ML IJ INJ
0.5000 mL | INJECTION | INTRAMUSCULAR | Status: AC
  Administered 2016-12-28: 0.5 mL via INTRAMUSCULAR
  Filled 2016-12-27 (×2): qty 0.5

## 2016-12-27 NOTE — H&P (Signed)
Julie Morton is a 31 y.o. female, G4 P61, EGA [redacted] weeks with EDC 4-15 presenting for elective induction.  Prenatal care essentially uncomplicated.  OB History    Gravida Para Term Preterm AB Living   SAB TAB Ectopic Multiple Live Births   1       2     Past Medical History:  Diagnosis Date  . Medical history non-contributory    Past Surgical History:  Procedure Laterality Date  . NO PAST SURGERIES     Family History: family history includes Cancer in her maternal grandmother; Cervical cancer in her maternal grandmother. Social History:  reports that she has been smoking Cigarettes.  She has a 5.00 pack-year smoking history. She has never used smokeless tobacco. She reports that she does not drink alcohol or use drugs.     Maternal Diabetes: No Genetic Screening: Normal Maternal Ultrasounds/Referrals: Normal Fetal Ultrasounds or other Referrals:  None Maternal Substance Abuse:  No Significant Maternal Medications:  None Significant Maternal Lab Results:  Lab values include: Group B Strep negative Other Comments:  None  Review of Systems  Respiratory: Negative.   Cardiovascular: Negative.    Maternal Medical History:  Fetal activity: Perceived fetal activity is normal.    Prenatal complications: no prenatal complications Prenatal Complications - Diabetes: none.    Dilation: 3 Effacement (%): 50 Station: -3 Exam by:: Milus Glazier, RN & Deatra Robinson, RN Blood pressure 119/80, pulse 90, temperature 98.4 F (36.9 C), temperature source Oral, resp. rate 16, height  (1.499 m), weight 82.1 kg (181 lb), last menstrual period 03/29/2016, SpO2 100 %. Maternal Exam:  Uterine Assessment: Contraction strength is mild.  Contraction frequency is irregular.   Abdomen: Patient reports no abdominal tenderness. Estimated fetal weight is 8 lbs.   Fetal presentation: vertex  Introitus: Normal vulva. Normal vagina.  Amniotic fluid character: not  assessed.  Pelvis: adequate for delivery.   Cervix: Cervix evaluated by digital exam.     Fetal Exam Fetal Monitor Review: Mode: ultrasound.   Baseline rate: 130.  Variability: moderate (6-25 bpm).   Pattern: accelerations present and no decelerations.    Fetal State Assessment: Category I - tracings are normal.     Physical Exam  Vitals reviewed. Constitutional: She appears well-developed and well-nourished.  Cardiovascular: Normal rate and regular rhythm.   Respiratory: Effort normal. No respiratory distress.  GI: Soft.    Prenatal labs: ABO, Rh: --/--/O POS (04/08 1610) Antibody: NEG (04/08 0705) Rubella: Nonimmune (09/14 0000) RPR: Nonreactive (09/14 0000)  HBsAg: Negative (09/14 0000)  HIV: Non-reactive (09/14 0000)  GBS: Negative (03/21 0000)   Assessment/Plan: IUP at 39 weeks for elective induction.  On pitocin, will monitor progress, AROM with lower station, anticipate SVD.     Leighton Roach Ramina Hulet 12/27/2016, 9:50 AM

## 2016-12-27 NOTE — Anesthesia Postprocedure Evaluation (Signed)
Anesthesia Post Note  Patient: Julie Morton  Procedure(s) Performed: * No procedures listed *  Patient location during evaluation: Mother Baby Anesthesia Type: Epidural Level of consciousness: awake and alert Pain management: satisfactory to patient Vital Signs Assessment: post-procedure vital signs reviewed and stable Respiratory status: respiratory function stable Cardiovascular status: stable Postop Assessment: no headache, no backache, epidural receding, patient able to bend at knees, no signs of nausea or vomiting and adequate PO intake Anesthetic complications: no        Last Vitals:  Vitals:   12/27/16 1717 12/27/16 1832  BP: (!) 121/58 121/71  Pulse: 82 85  Resp: 16 18  Temp: 36.9 C 37.1 C    Last Pain:  Vitals:   12/27/16 2030  TempSrc:   PainSc: 0-No pain   Pain Goal:                 Richerd Grime

## 2016-12-27 NOTE — Anesthesia Preprocedure Evaluation (Signed)
Anesthesia Evaluation  Patient identified by MRN, date of birth, ID band Patient awake    Reviewed: Allergy & Precautions, H&P , NPO status , Patient's Chart, lab work & pertinent test results  Airway Mallampati: II   Neck ROM: full    Dental   Pulmonary Current Smoker,    breath sounds clear to auscultation       Cardiovascular negative cardio ROS   Rhythm:regular Rate:Normal     Neuro/Psych    GI/Hepatic   Endo/Other  obese  Renal/GU      Musculoskeletal   Abdominal   Peds  Hematology   Anesthesia Other Findings   Reproductive/Obstetrics (+) Pregnancy                             Anesthesia Physical Anesthesia Plan  ASA: II  Anesthesia Plan: Epidural   Post-op Pain Management:    Induction: Intravenous  Airway Management Planned: Natural Airway  Additional Equipment:   Intra-op Plan:   Post-operative Plan:   Informed Consent: I have reviewed the patients History and Physical, chart, labs and discussed the procedure including the risks, benefits and alternatives for the proposed anesthesia with the patient or authorized representative who has indicated his/her understanding and acceptance.     Plan Discussed with: CRNA, Anesthesiologist and Surgeon  Anesthesia Plan Comments:         Anesthesia Quick Evaluation

## 2016-12-27 NOTE — Anesthesia Procedure Notes (Signed)
Epidural Patient location during procedure: OB Start time: 12/27/2016 11:34 AM End time: 12/27/2016 11:45 AM  Staffing Anesthesiologist: Chaney Malling, Janeal Abadi Performed: anesthesiologist   Preanesthetic Checklist Completed: patient identified, site marked, pre-op evaluation, timeout performed, IV checked, risks and benefits discussed and monitors and equipment checked  Epidural Patient position: sitting Prep: DuraPrep Patient monitoring: heart rate, cardiac monitor, continuous pulse ox and blood pressure Approach: midline Location: L2-L3 Injection technique: LOR saline  Needle:  Needle type: Tuohy  Needle gauge: 17 G Needle length: 9 cm Needle insertion depth: 6 cm Catheter type: closed end flexible Catheter size: 19 Gauge Catheter at skin depth: 11 cm Test dose: negative and Other  Assessment Events: blood not aspirated, injection not painful, no injection resistance and negative IV test  Additional Notes Informed consent obtained prior to proceeding including risk of failure, 1% risk of PDPH, risk of minor discomfort and bruising.  Discussed rare but serious complications including epidural abscess, permanent nerve injury, epidural hematoma.  Discussed alternatives to epidural analgesia and patient desires to proceed.  Timeout performed pre-procedure verifying patient name, procedure, and platelet count.  Patient tolerated procedure well. Reason for block:procedure for pain

## 2016-12-27 NOTE — Anesthesia Pain Management Evaluation Note (Signed)
  CRNA Pain Management Visit Note  Patient: Julie Morton, 31 y.o., female  "Hello I am a member of the anesthesia team at [facility]. We have an anesthesia team available at all times to provide care throughout the hospital, including epidural management and anesthesia for C-section. I don't know your plan for the delivery whether it a natural birth, water birth, IV sedation, nitrous supplementation, doula or epidural, but we want to meet your pain goals."   1.Was your pain managed to your expectations on prior hospitalizations?   Yes   2.What is your expectation for pain management during this hospitalization?     Epidural  3.How can we help you reach that goal? Support prn  Record the patient's initial score and the patient's pain goal.   Pain: 1  Pain Goal: 5 [facility] wants you to be able to say your pain was always managed very well.  [facility] 12/27/2016

## 2016-12-27 NOTE — Progress Notes (Signed)
Comfortable with epidural Afeb, VSS FHT- Cat I, ctx q 1-2 min VE-6/80/-1, vtx, AROM clear Will turn pitocin down from 14 to 8 mu/min, anticipate SVD

## 2016-12-28 MED ORDER — IBUPROFEN 600 MG PO TABS: 600.0000 mg | ORAL_TABLET | Freq: Four times a day (QID) | ORAL | 0 refills | Status: DC

## 2016-12-28 NOTE — Progress Notes (Signed)
CSW received consult for hx of anxiety.  CSW completed chart review and does not note this dx in MOB's hx.  CSW screening out referral and asks to be contacted if concerns arise.  CSW spoke with bedside RN and pediatric staff. 

## 2016-12-28 NOTE — Discharge Summary (Signed)
OB Discharge Summary     Patient Name: Julie Morton DOB: 1985/12/10 MRN: [id number]  Date of admission: 12/27/2016 Delivering MD: Jackelyn Knife, TODD   Date of discharge: 12/28/2016  Admitting diagnosis: INDUCTION 39WKS Intrauterine pregnancy: [redacted]w[redacted]d     Secondary diagnosis:  Active Problems:   Normal pregnancy in third trimester   SVD (spontaneous vaginal delivery)  Additional problems: none     Discharge diagnosis: Term Pregnancy Delivered                                                                                                Post partum procedures:none  Augmentation: AROM and Pitocin  Complications: None  Hospital course:  Induction of Labor With Vaginal Delivery   31 y.o. yo [phone] at [redacted]w[redacted]d was admitted to the hospital 12/27/2016 for induction of labor.  Indication for induction: Favorable cervix at term.  Patient had an uncomplicated labor course as follows: Membrane Rupture Time/Date: 1:42 PM ,12/27/2016   Intrapartum Procedures: Episiotomy: None [1]                                         Lacerations:  1st degree [2]  Patient had delivery of a Viable infant.  Information for the patient's newborn:  Laaibah, Wartman [[id number]]  Delivery Method: Vaginal, Spontaneous Delivery (Filed from Delivery Summary)   12/27/2016  Details of delivery can be found in separate delivery note.  Patient had a routine postpartum course. Patient is discharged home 12/28/16.  Physical exam  Vitals:   12/27/16 1717 12/27/16 1832 12/27/16 2330 12/28/16 0500  BP: (!) 121/58 121/71 104/65 110/64  Pulse: 82 85 66 79  Resp: Temp: 98.4 F (36.9 C) 98.8 F (37.1 C) 98 F (36.7 C) 98.2 F (36.8 C)  TempSrc: Oral Oral Oral Axillary  SpO2:   99%   Weight:      Height:       General: alert and cooperative Lochia: appropriate Uterine Fundus: firm  Labs: Lab Results  Component Value Date   WBC 10.4 12/27/2016   HGB 10.5 (L) 12/27/2016   HCT 30.6 (L) 12/27/2016   MCV  87.4 12/27/2016   PLT 314 12/27/2016   CMP Latest Ref Rng & Units 06/05/2010  Glucose 70 - 99 mg/dL 86  BUN 6 - 23 mg/dL 3(L)  Creatinine 0.4 - 1.2 mg/dL 9.62  Sodium 952 - 841 mEq/L 133(L)  Potassium 3.5 - 5.1 mEq/L 3.5  Chloride 96 - 112 mEq/L 105  CO2 19 - 32 mEq/L 20  Calcium 8.4 - 10.5 mg/dL 8.6  Total Protein 6.0 - 8.3 g/dL 6.0  Total Bilirubin 0.3 - 1.2 mg/dL 0.6  Alkaline Phos 39 - 117 U/L 133(H)  AST 0 - 37 U/L 15  ALT 0 - 35 U/L 8    Discharge instruction: per After Visit Summary and "Baby and Me Booklet".  After visit meds:  Allergies as of 12/28/2016      Reactions   Amoxicillin Hives, Other (  See Comments)   Has patient had a PCN reaction causing immediate rash, facial/tongue/throat swelling, SOB or lightheadedness with hypotension: No Has patient had a PCN reaction causing severe rash involving mucus membranes or skin necrosis: No Has patient had a PCN reaction that required hospitalization No Has patient had a PCN reaction occurring within the last 10 years: Yes If all of the above answers are "NO", then may proceed with Cephalosporin use.   Penicillins Hives, Other (See Comments)   Has patient had a PCN reaction causing immediate rash, facial/tongue/throat swelling, SOB or lightheadedness with hypotension: No Has patient had a PCN reaction causing severe rash involving mucus membranes or skin necrosis: No Has patient had a PCN reaction that required hospitalization No Has patient had a PCN reaction occurring within the last 10 years: Yes If all of the above answers are "NO", then may proceed with Cephalosporin use.      Medication List    TAKE these medications   FLINTSTONES GUMMIES COMPLETE Chew Chew 2 each by mouth daily.   ibuprofen 600 MG tablet Commonly known as:  ADVIL,MOTRIN Take 1 tablet (600 mg total) by mouth every 6 (six) hours.   loratadine 10 MG tablet Commonly known as:  CLARITIN Take 10 mg by mouth daily as needed for allergies.    ranitidine 150 MG tablet Commonly known as:  ZANTAC Take 150 mg by mouth 2 (two) times daily as needed for heartburn.       Diet: routine diet  Activity: Advance as tolerated. Pelvic rest for 6 weeks.   Outpatient follow up:6 weeks Follow up Appt:No future appointments. Follow up Visit:No Follow-up on file.  Postpartum contraception: IUD Mirena  Newborn Data: Live born female  Birth Weight: 7 lb 2.6 oz (3249 g) APGAR: 9, 9  Baby Feeding: Bottle Disposition:home with mother   12/28/2016 Oliver Pila, MD

## 2016-12-28 NOTE — Progress Notes (Signed)
Post Partum Day 1 Subjective: no complaints, up ad lib and tolerating PO   Pt requests early d/c home. Objective: Blood pressure 110/64, pulse 79, temperature 98.2 F (36.8 C), temperature source Axillary, resp. rate 18, height  (1.499 m), weight 82.1 kg (181 lb), last menstrual period 03/29/2016, SpO2 99 %, unknown if currently breastfeeding.  Physical Exam:  General: alert and cooperative Lochia: appropriate Uterine Fundus: firm    Recent Labs  12/27/16 0739  HGB 10.5*  HCT 30.6*    Assessment/Plan: Discharge home if baby able to go   LOS: 1 day   Julie Morton 12/28/2016, 8:59 AM

## 2017-11-02 ENCOUNTER — Ambulatory Visit (INDEPENDENT_AMBULATORY_CARE_PROVIDER_SITE_OTHER): Payer: Commercial Managed Care - PPO | Admitting: Family Medicine

## 2017-11-02 ENCOUNTER — Encounter: Payer: Self-pay | Admitting: Family Medicine

## 2017-11-02 VITALS — BP 131/89 | HR 108 | Temp 98.0°F | Ht 59.0 in | Wt 165.0 lb

## 2017-11-02 DIAGNOSIS — R52 Pain, unspecified: Secondary | ICD-10-CM | POA: Diagnosis not present

## 2017-11-02 DIAGNOSIS — Z720 Tobacco use: Secondary | ICD-10-CM | POA: Diagnosis not present

## 2017-11-02 DIAGNOSIS — F17200 Nicotine dependence, unspecified, uncomplicated: Secondary | ICD-10-CM | POA: Insufficient documentation

## 2017-11-02 DIAGNOSIS — J40 Bronchitis, not specified as acute or chronic: Secondary | ICD-10-CM

## 2017-11-02 LAB — VERITOR FLU A/B WAIVED
INFLUENZA A: NEGATIVE
INFLUENZA B: NEGATIVE

## 2017-11-02 MED ORDER — AZITHROMYCIN 250 MG PO TABS: ORAL_TABLET | ORAL | 0 refills | Status: DC

## 2017-11-02 MED ORDER — BENZONATATE 200 MG PO CAPS: 200.0000 mg | ORAL_CAPSULE | Freq: Two times a day (BID) | ORAL | 0 refills | Status: DC | PRN

## 2017-11-02 NOTE — Progress Notes (Signed)
Subjective: CC:? Flu PCP: Frederica Kuster, MD EAV:[id number] Julie Morton is a 32 y.o. female presenting to clinic today for:  1. Cold symptoms  Patient reports productive cough with dark green sputum, rib pain, sinus congestion, nasal congestion and myalgia that started Tuesday.  She notes that the symptoms seem to worsen yesterday but she was unable to secure an appointment here in office.  She notes multiple sick contacts at work.  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  Denies hemoptysis, SOB, dizziness, rash, nausea, vomiting, diarrhea, fevers, chills, recent travel.  Patient has used Alka-Seltzer tablets and ibuprofen with little relief of symptoms.  Denies history of COPD or asthma.  She reports current daily tobacco use/ exposure.  ROS: Per HPI  Allergies  Allergen Reactions  . Amoxicillin Hives and Other (See Comments)    Has patient had a PCN reaction causing immediate rash, facial/tongue/throat swelling, SOB or lightheadedness with hypotension: No Has patient had a PCN reaction causing severe rash involving mucus membranes or skin necrosis: No Has patient had a PCN reaction that required hospitalization No Has patient had a PCN reaction occurring within the last 10 years: Yes If all of the above answers are "NO", then may proceed with Cephalosporin use.   Marland Kitchen Penicillins Hives and Other (See Comments)    Has patient had a PCN reaction causing immediate rash, facial/tongue/throat swelling, SOB or lightheadedness with hypotension: No Has patient had a PCN reaction causing severe rash involving mucus membranes or skin necrosis: No Has patient had a PCN reaction that required hospitalization No Has patient had a PCN reaction occurring within the last 10 years: Yes If all of the above answers are "NO", then may proceed with Cephalosporin use.   Past Medical History:  Diagnosis Date  . Medical history non-contributory     Current Outpatient Medications:    .  ibuprofen (ADVIL,MOTRIN) 600 MG tablet, Take 1 tablet (600 mg total) by mouth every 6 (six) hours., Disp: 30 tablet, Rfl: 0 .  loratadine (CLARITIN) 10 MG tablet, Take 10 mg by mouth daily as needed for allergies., Disp: , Rfl:  .  Pediatric Multivit-Minerals-C (FLINTSTONES GUMMIES COMPLETE) CHEW, Chew 2 each by mouth daily., Disp: , Rfl:  .  ranitidine (ZANTAC) 150 MG tablet, Take 150 mg by mouth 2 (two) times daily as needed for heartburn., Disp: , Rfl:  Social History   Socioeconomic History  . Marital status: Married    Spouse name: Not on file  . Number of children: Not on file  . Years of education: Not on file  . Highest education level: Not on file  Social Needs  . Financial resource strain: Not on file  . Food insecurity - worry: Not on file  . Food insecurity - inability: Not on file  . Transportation needs - medical: Not on file  . Transportation needs - non-medical: Not on file  Occupational History  . Not on file  Tobacco Use  . Smoking status: Current Every Day Smoker    Packs/day: 0.50    Years: 10.00    Pack years: 5.00    Types: Cigarettes  . Smokeless tobacco: Never Used  Substance and Sexual Activity  . Alcohol use: No  . Drug use: No  . Sexual activity: Yes  Other Topics Concern  . Not on file  Social History Narrative  . Not on file   Family History  Problem Relation Age of Onset  . Cervical cancer Maternal Grandmother   .  Cancer Maternal Grandmother     Objective: Office vital signs reviewed. BP 131/89   Pulse (!) 108   Temp 98 F (36.7 C) (Oral)   Ht 4\' 11"  (1.499 m)   Wt 165 lb (74.8 kg)   BMI 33.33 kg/m   Physical Examination:  General: Awake, alert, well nourished, nontoxic appearing, No acute distress HEENT: no sinus TTP    Neck: No masses palpated. No lymphadenopathy    Ears: Tympanic membranes intact, normal light reflex, no erythema, no bulging    Eyes: PERRLA, extraocular membranes intact, sclera white    Nose: nasal  turbinates moist, clear nasal discharge    Throat: moist mucus membranes, no erythema, no tonsillar exudate.  Airway is patent Cardio: regular rate and rhythm, S1S2 heard, no murmurs appreciated Pulm: clear to auscultation bilaterally, no wheezes, rhonchi or rales; normal work of breathing on room air  Assessment/ Plan: 32 y.o. female   1. Bronchitis Given long-standing history of smoking, will cover with antibiotic for presumed bronchitis.  She is relatively well-appearing on today's exam.  She is afebrile.  Her heart rate was actually within normal limits on my exam.  Azithromycin 500 mg day 1; then 250 mg days 2 through 5.  Tessalon Perles 200 mg p.o. twice daily as needed cough.  Continue Motrin if needed for body aches.  Home care instructions were reviewed and handout was provided.. Strict return precautions and reasons for emergent evaluation in the emergency department review with patient.  They voiced understanding and will follow-up as needed.  Work note excusing from missed work and the next couple of days provided.  2. Body aches Rapid flu negative. - Veritor Flu A/B Waived  3. Smokes tobacco daily Recommending cessation.  Will continue to counsel with each visit.   Orders Placed This Encounter  Procedures  . Veritor Flu A/B Waived    Order Specific Question:   Source    Answer:   nasal   Meds ordered this encounter  Medications  . azithromycin (ZITHROMAX Z-PAK) 250 MG tablet    Sig: Take 500 mg day 1, then take 250 mg day 2 through 5.    Dispense:  6 tablet    Refill:  0  . benzonatate (TESSALON) 200 MG capsule    Sig: Take 1 capsule (200 mg total) by mouth 2 (two) times daily as needed for cough.    Dispense:  20 capsule    Refill:  0     Semone Orlov Hulen SkainsM Ahmeer Tuman, DO [facility] [phone]

## 2017-11-02 NOTE — Patient Instructions (Signed)
-   Get plenty of rest and drink plenty of fluids. - Try to breathe moist air. Use a cold mist humidifier. - Consume warm fluids (soup or tea) to provide relief for a stuffy nose and to loosen phlegm. - For nasal stuffiness, try saline nasal spray or a Neti Pot. Afrin nasal spray can also be used but this product should not be used longer than 3 days or it will cause rebound nasal stuffiness (worsening nasal congestion). - For sore throat pain relief: suck on throat lozenges, hard candy or popsicles; gargle with warm salt water (1/4 tsp. salt per 8 oz. of water); and eat soft, bland foods. - Eat a well-balanced diet. If you cannot, ensure you are getting enough nutrients by taking a daily multivitamin. - Avoid dairy products, as they can thicken phlegm. - Avoid alcohol, as it impairs your body's immune system.  CONTACT YOUR DOCTOR IF YOU EXPERIENCE ANY OF THE FOLLOWING: - High fever - Ear pain - Sinus-type headache - Unusually severe cold symptoms - Cough that gets worse while other cold symptoms improve - Flare up of any chronic lung problem, such as asthma - Your symptoms persist longer than 2 weeks   Acute Bronchitis, Adult Acute bronchitis is when air tubes (bronchi) in the lungs suddenly get swollen. The condition can make it hard to breathe. It can also cause these symptoms:  A cough.  Coughing up clear, yellow, or green mucus.  Wheezing.  Chest congestion.  Shortness of breath.  A fever.  Body aches.  Chills.  A sore throat.  Follow these instructions at home: Medicines  Take over-the-counter and prescription medicines only as told by your doctor.  If you were prescribed an antibiotic medicine, take it as told by your doctor. Do not stop taking the antibiotic even if you start to feel better. General instructions  Rest.  Drink enough fluids to keep your pee (urine) clear or pale yellow.  Avoid smoking and secondhand smoke. If you smoke and you need help  quitting, ask your doctor. Quitting will help your lungs heal faster.  Use an inhaler, cool mist vaporizer, or humidifier as told by your doctor.  Keep all follow-up visits as told by your doctor. This is important. How is this prevented? To lower your risk of getting this condition again:  Wash your hands often with soap and water. If you cannot use soap and water, use hand sanitizer.  Avoid contact with people who have cold symptoms.  Try not to touch your hands to your mouth, nose, or eyes.  Make sure to get the flu shot every year.  Contact a doctor if:  Your symptoms do not get better in 2 weeks. Get help right away if:  You cough up blood.  You have chest pain.  You have very bad shortness of breath.  You become dehydrated.  You faint (pass out) or keep feeling like you are going to pass out.  You keep throwing up (vomiting).  You have a very bad headache.  Your fever or chills gets worse. This information is not intended to replace advice given to you by your health care provider. Make sure you discuss any questions you have with your health care provider. Document Released: 02/24/2008 Document Revised: 04/15/2016 Document Reviewed: 02/26/2016 Elsevier Interactive Patient Education  [employer]2018 [employer]

## 2017-11-03 ENCOUNTER — Encounter: Payer: Self-pay | Admitting: Family Medicine

## 2019-01-10 ENCOUNTER — Ambulatory Visit (INDEPENDENT_AMBULATORY_CARE_PROVIDER_SITE_OTHER): Payer: Commercial Managed Care - PPO | Admitting: Family Medicine

## 2019-01-10 ENCOUNTER — Ambulatory Visit (INDEPENDENT_AMBULATORY_CARE_PROVIDER_SITE_OTHER): Payer: Commercial Managed Care - PPO

## 2019-01-10 ENCOUNTER — Encounter: Payer: Self-pay | Admitting: Family Medicine

## 2019-01-10 ENCOUNTER — Other Ambulatory Visit: Payer: Self-pay

## 2019-01-10 VITALS — BP 117/85 | HR 93 | Temp 99.4°F | Wt 170.0 lb

## 2019-01-10 DIAGNOSIS — M65342 Trigger finger, left ring finger: Secondary | ICD-10-CM | POA: Diagnosis not present

## 2019-01-10 DIAGNOSIS — M79642 Pain in left hand: Secondary | ICD-10-CM | POA: Diagnosis not present

## 2019-01-10 MED ORDER — PREDNISONE 20 MG PO TABS: ORAL_TABLET | ORAL | 0 refills | Status: DC

## 2019-01-10 MED ORDER — NAPROXEN 500 MG PO TABS: 500.0000 mg | ORAL_TABLET | Freq: Two times a day (BID) | ORAL | 1 refills | Status: DC

## 2019-01-10 MED ORDER — NAPROXEN 500 MG PO TABS
500.0000 mg | ORAL_TABLET | Freq: Two times a day (BID) | ORAL | 1 refills | Status: DC
Start: 2019-01-10 — End: 2019-04-04

## 2019-01-10 NOTE — Progress Notes (Signed)
Subjective:  Patient ID: Julie Morton, female    DOB: 1985/11/30, 33 y.o.   MRN: [id number]  Chief Complaint:  Hand Pain   HPI: Julie Morton is a 33 y.o. female presenting on 01/10/2019 for Hand Pain  Pt presents today with left hand and left ring finger pain. Pt states this started a few days ago and is worse today. Pt states she has not injured her hand that she can recall. States she does type daily and this seems to exacerbate the pain. She states the pain is sharp to throbbing. The pain is worse when she tried to straighten her ring finger. States she has not tried anything for the pain. The pain is a 6/10 at worst.   Relevant past medical, surgical, family, and social history reviewed and updated as indicated.  Allergies and medications reviewed and updated.   Past Medical History:  Diagnosis Date  . Medical history non-contributory     Past Surgical History:  Procedure Laterality Date  . NO PAST SURGERIES      Social History   Socioeconomic History  . Marital status: Married    Spouse name: Not on file  . Number of children: Not on file  . Years of education: Not on file  . Highest education level: Not on file  Occupational History  . Not on file  Social Needs  . Financial resource strain: Not on file  . Food insecurity:    Worry: Not on file    Inability: Not on file  . Transportation needs:    Medical: Not on file    Non-medical: Not on file  Tobacco Use  . Smoking status: Current Every Day Smoker    Packs/day: 0.50    Years: 10.00    Pack years: 5.00    Types: Cigarettes  . Smokeless tobacco: Never Used  Substance and Sexual Activity  . Alcohol use: No  . Drug use: No  . Sexual activity: Yes  Lifestyle  . Physical activity:    Days per week: Not on file    Minutes per session: Not on file  . Stress: Not on file  Relationships  . Social connections:    Talks on phone: Not on file    Gets together: Not on file    Attends religious  service: Not on file    Active member of club or organization: Not on file    Attends meetings of clubs or organizations: Not on file    Relationship status: Not on file  . Intimate partner violence:    Fear of current or ex partner: Not on file    Emotionally abused: Not on file    Physically abused: Not on file    Forced sexual activity: Not on file  Other Topics Concern  . Not on file  Social History Narrative  . Not on file    Outpatient Encounter Medications as of 01/10/2019  Medication Sig  . ibuprofen (ADVIL,MOTRIN) 600 MG tablet Take 1 tablet (600 mg total) by mouth every 6 (six) hours.  Marland Kitchen loratadine (CLARITIN) 10 MG tablet Take 10 mg by mouth daily as needed for allergies.  . naproxen (NAPROSYN) 500 MG tablet Take 1 tablet (500 mg total) by mouth 2 (two) times daily with a meal.  . predniSONE (DELTASONE) 20 MG tablet 2 po at sametime daily for 5 days  . [DISCONTINUED] azithromycin (ZITHROMAX Z-PAK) 250 MG tablet Take 500 mg day 1, then take 250 mg day  2 through 5.  . [DISCONTINUED] benzonatate (TESSALON) 200 MG capsule Take 1 capsule (200 mg total) by mouth 2 (two) times daily as needed for cough.  . [DISCONTINUED] naproxen (NAPROSYN) 500 MG tablet Take 1 tablet (500 mg total) by mouth 2 (two) times daily with a meal.  . [DISCONTINUED] predniSONE (DELTASONE) 20 MG tablet 2 po at sametime daily for 5 days   No facility-administered encounter medications on file as of 01/10/2019.     Allergies  Allergen Reactions  . Amoxicillin Hives and Other (See Comments)    Has patient had a PCN reaction causing immediate rash, facial/tongue/throat swelling, SOB or lightheadedness with hypotension: No Has patient had a PCN reaction causing severe rash involving mucus membranes or skin necrosis: No Has patient had a PCN reaction that required hospitalization No Has patient had a PCN reaction occurring within the last 10 years: Yes If all of the above answers are "NO", then may proceed with  Cephalosporin use.   Marland Kitchen Penicillins Hives and Other (See Comments)    Has patient had a PCN reaction causing immediate rash, facial/tongue/throat swelling, SOB or lightheadedness with hypotension: No Has patient had a PCN reaction causing severe rash involving mucus membranes or skin necrosis: No Has patient had a PCN reaction that required hospitalization No Has patient had a PCN reaction occurring within the last 10 years: Yes If all of the above answers are "NO", then may proceed with Cephalosporin use.    Review of Systems  Constitutional: Negative for chills, fatigue and fever.  Respiratory: Negative for cough and shortness of breath.   Cardiovascular: Negative for chest pain, palpitations and leg swelling.  Musculoskeletal: Positive for arthralgias (left ring finger).  Neurological: Negative for weakness and numbness.  Psychiatric/Behavioral: Negative for confusion.  All other systems reviewed and are negative.       Objective:  BP 117/85   Pulse 93   Temp 99.4 F (37.4 C)   Wt 170 lb (77.1 kg)   BMI 34.34 kg/m    Wt Readings from Last 3 Encounters:  01/10/19 170 lb (77.1 kg)  11/02/17 165 lb (74.8 kg)  12/27/16 181 lb (82.1 kg)    Physical Exam Vitals signs and nursing note reviewed.  Constitutional:      General: She is not in acute distress.    Appearance: Normal appearance. She is not ill-appearing or toxic-appearing.  HENT:     Head: Normocephalic and atraumatic.     Mouth/Throat:     Mouth: Mucous membranes are moist.  Eyes:     Conjunctiva/sclera: Conjunctivae normal.     Pupils: Pupils are equal, round, and reactive to light.  Neck:     Musculoskeletal: Normal range of motion and neck supple.  Cardiovascular:     Rate and Rhythm: Normal rate and regular rhythm.     Pulses: Normal pulses.     Heart sounds: Normal heart sounds. No murmur. No friction rub. No gallop.   Pulmonary:     Effort: Pulmonary effort is normal. No respiratory distress.      Breath sounds: Normal breath sounds.  Musculoskeletal:     Left elbow: Normal.     Left wrist: Normal.     Left hand: She exhibits tenderness. She exhibits no bony tenderness, normal two-point discrimination, normal capillary refill, no deformity, no laceration and no swelling. Normal sensation noted. Decreased strength noted. She exhibits finger abduction. She exhibits no thumb/finger opposition and no wrist extension trouble.       Hands:  Skin:    General: Skin is warm and dry.     Capillary Refill: Capillary refill takes less than 2 seconds.  Neurological:     General: No focal deficit present.     Mental Status: She is alert and oriented to person, place, and time.  Psychiatric:        Mood and Affect: Mood normal.        Behavior: Behavior normal. Behavior is cooperative.        Thought Content: Thought content normal.        Judgment: Judgment normal.     Results for orders placed or performed in visit on 11/02/17  Veritor Flu A/B Waived  Result Value Ref Range   Influenza A Negative Negative   Influenza B Negative Negative     X-Ray: left hand: No acute findings. Preliminary x-ray reading by Kari BaarsMichelle Hansika Leaming, FNP-C, [facility].   Pertinent labs & imaging results that were available during my care of the patient were reviewed by me and considered in my medical decision making.  Assessment & Plan:  Merry ProudBrandi was seen today for hand pain.  Diagnoses and all orders for this visit:  Left hand pain Negative xray. Will notify pt if radiology reading differs.  -     DG Hand Complete Left; Future  Trigger ring finger of left hand Point tenderness and pain with flexion and extension. Pt declines trigger point injection. Will treat with oral steroids and NSAIDS. Report any new or worsening symptoms. Reevaluate in 2-4 weeks.  -     naproxen (NAPROSYN) 500 MG tablet; Take 1 tablet (500 mg total) by mouth 2 (two) times daily with a meal. -     predniSONE (DELTASONE) 20 MG tablet; 2 po at  sametime daily for 5 days   Continue all other maintenance medications.  Follow up plan: Return in about 4 weeks (around 02/07/2019), or if symptoms worsen or fail to improve.  Educational handout given for trigger finger  The above assessment and management plan was discussed with the patient. The patient verbalized understanding of and has agreed to the management plan. Patient is aware to call the clinic if symptoms persist or worsen. Patient is aware when to return to the clinic for a follow-up visit. Patient educated on when it is appropriate to go to the emergency department.   Kari BaarsMichelle Mykal Kirchman, FNP-C [facility] [phone]

## 2019-01-10 NOTE — Patient Instructions (Addendum)
Trigger Finger    Trigger finger (stenosing tenosynovitis) is a condition that causes a finger to get stuck in a bent position. Each finger has a tough, cord-like tissue that connects muscle to bone (tendon), and each tendon is surrounded by a tunnel of tissue (tendon sheath). To move your finger, your tendon needs to slide freely through the sheath. Trigger finger happens when the tendon or the sheath thickens, making it difficult to move your finger.  Trigger finger can affect any finger or a thumb. It may affect more than one finger. Mild cases may clear up with rest and medicine. Severe cases require more treatment.  What are the causes?  Trigger finger is caused by a thickened finger tendon or tendon sheath. The cause of this thickening is not known.  What increases the risk?  The following factors may make you more likely to develop this condition:   Doing activities that require a strong grip.   Having rheumatoid arthritis, gout, or diabetes.   Being 40-60 years old.   Being a woman.  What are the signs or symptoms?  Symptoms of this condition include:   Pain when bending or straightening your finger.   Tenderness or swelling where your finger attaches to the palm of your hand.   A lump in the palm of your hand or on the inside of your finger.   Hearing a popping sound when you try to straighten your finger.   Feeling a popping, catching, or locking sensation when you try to straighten your finger.   Being unable to straighten your finger.  How is this diagnosed?  This condition is diagnosed based on your symptoms and a physical exam.  How is this treated?  This condition may be treated by:   Resting your finger and avoiding activities that make symptoms worse.   Wearing a finger splint to keep your finger in a slightly bent position.   Taking NSAIDs to relieve pain and swelling.   Injecting medicine (steroids) into the tendon sheath to reduce swelling and irritation. Injections may need to be  repeated.   Having surgery to open the tendon sheath. This may be done if other treatments do not work and you cannot straighten your finger. You may need physical therapy after surgery.  Follow these instructions at home:     Use moist heat to help reduce pain and swelling as told by your health care provider.   Rest your finger and avoid activities that make pain worse. Return to normal activities as told by your health care provider.   If you have a splint, wear it as told by your health care provider.   Take over-the-counter and prescription medicines only as told by your health care provider.   Keep all follow-up visits as told by your health care provider. This is important.  Contact a health care provider if:   Your symptoms are not improving with home care.  Summary   Trigger finger (stenosing tenosynovitis) causes your finger to get stuck in a bent position, and it can make it difficult and painful to straighten your finger.   This condition develops when a finger tendon or tendon sheath thickens.   Treatment starts with resting, wearing a splint, and taking NSAIDs.   In severe cases, surgery to open the tendon sheath may be needed.  This information is not intended to replace advice given to you by your health care provider. Make sure you discuss any questions you have with your   health care provider.  Document Released: 06/27/2004 Document Revised: 08/18/2016 Document Reviewed: 08/18/2016  Elsevier Interactive Patient Education  2019 [employer]  Hand Pain  Many things can cause hand pain. Some common causes are:   An injury.   Repeating the same movement with your hand over and over (overuse).   Osteoporosis.   Arthritis.   Lumps in the tendons or joints of the hand and wrist (ganglion cysts).   Infection.  Follow these instructions at home:  Pay attention to any changes in your symptoms. Take these actions to help with your discomfort:   If directed, put ice on the affected  area:  ? Put ice in a plastic bag.  ? Place a towel between your skin and the bag.  ? Leave the ice on for 15-20 minutes, 3?4 times a day for 2 days.   Take over-the-counter and prescription medicines only as told by your health care provider.   Minimize stress on your hands and wrists as much as possible.   Take breaks from repetitive activity often.   Do stretches as told by your health care provider.   Do not do activities that make your pain worse.  Contact a health care provider if:   Your pain does not get better after a few days of self-care.   Your pain gets worse.   Your pain affects your ability to do your daily activities.  Get help right away if:   Your hand becomes warm, red, or swollen.   Your hand is numb or tingling.   Your hand is extremely swollen or deformed.   Your hand or fingers turn white or blue.   You cannot move your hand, wrist, or fingers.  This information is not intended to replace advice given to you by your health care provider. Make sure you discuss any questions you have with your health care provider.  Document Released: 10/04/2015 Document Revised: 02/13/2016 Document Reviewed: 10/03/2014  Elsevier Interactive Patient Education  2019 [employer]

## 2019-04-04 ENCOUNTER — Ambulatory Visit (INDEPENDENT_AMBULATORY_CARE_PROVIDER_SITE_OTHER): Payer: Commercial Managed Care - PPO | Admitting: Family Medicine

## 2019-04-04 ENCOUNTER — Other Ambulatory Visit: Payer: Self-pay

## 2019-04-04 VITALS — BP 128/80 | HR 90 | Temp 99.4°F | Ht 59.0 in | Wt 174.0 lb

## 2019-04-04 DIAGNOSIS — R220 Localized swelling, mass and lump, head: Secondary | ICD-10-CM

## 2019-04-04 DIAGNOSIS — Z566 Other physical and mental strain related to work: Secondary | ICD-10-CM | POA: Diagnosis not present

## 2019-04-04 MED ORDER — PREDNISONE 10 MG (21) PO TBPK: ORAL_TABLET | ORAL | 0 refills | Status: DC

## 2019-04-04 MED ORDER — FLUOXETINE HCL 20 MG PO CAPS: 20.0000 mg | ORAL_CAPSULE | Freq: Every day | ORAL | 1 refills | Status: DC

## 2019-04-04 MED ORDER — EPINEPHRINE 0.3 MG/0.3ML IJ SOAJ: 0.3000 mg | INTRAMUSCULAR | 0 refills | Status: AC | PRN

## 2019-04-04 MED ORDER — METHYLPREDNISOLONE ACETATE 80 MG/ML IJ SUSP: 80.0000 mg | Freq: Once | INTRAMUSCULAR | Status: DC

## 2019-04-04 MED ORDER — DIPHENHYDRAMINE HCL 50 MG/ML IJ SOLN: 25.0000 mg | Freq: Once | INTRAMUSCULAR | Status: DC

## 2019-04-04 MED ORDER — HYDROXYZINE HCL 50 MG PO TABS: 25.0000 mg | ORAL_TABLET | Freq: Three times a day (TID) | ORAL | 0 refills | Status: DC | PRN

## 2019-04-04 MED ORDER — DIPHENHYDRAMINE HCL 50 MG/ML IJ SOLN
25.0000 mg | Freq: Once | INTRAMUSCULAR | Status: AC
  Administered 2019-04-04: 25 mg via INTRAMUSCULAR

## 2019-04-04 MED ORDER — METHYLPREDNISOLONE ACETATE 40 MG/ML IJ SUSP
80.0000 mg | Freq: Once | INTRAMUSCULAR | Status: AC
  Administered 2019-04-04: 80 mg via INTRAMUSCULAR

## 2019-04-04 NOTE — Addendum Note (Signed)
Addended byCarrolyn Leigh on: 04/04/2019 04:14 PM   Modules accepted: Orders

## 2019-04-04 NOTE — Patient Instructions (Signed)
I question angioedema.  This can be inherited.  More information below.   Angioedema Angioedema is the sudden swelling of tissue in the body. Angioedema can affect any part of the body, but it most often affects the deeper parts of the skin, causing red, itchy patches (hives) to appear over the affected area. It often begins during the night and is found in the morning. Depending on the cause, angioedema may happen:  Only once.  Several times. It may come back in unpredictable patterns.  Repeatedly for several years. Over time, it may gradually stop coming back. Angioedema can be life-threatening if it affects the air passages that you breathe through. What are the causes? This condition may be caused by:  Foods, such as milk, eggs, shellfish, wheat, or nuts.  Certain medicines, such as ACE inhibitors, antibiotics, nonsteroidal anti-inflammatory drugs, birth control pills, or dyes used in X-rays.  Insect stings.  Infections. Angioedema can be inherited, and episodes can be triggered by:  Mild injury.  Dental work.  Surgery.  Stress.  Sudden changes in temperature.  Exercise. In some cases, the cause of this condition is not known. What are the signs or symptoms? Symptoms of this condition depend on where the swelling happens. Symptoms may include:  Swollen skin.  Red, itchy patches of skin (hives).  Redness in the affected area.  Pain in the affected area.  Swollen lips or tongue.  Wheezing.  Breathing problems.  If your internal organs are involved, symptoms may also include:  Nausea.  Abdominal pain.  Vomiting.  Difficulty swallowing.  Difficulty passing urine. How is this diagnosed? This condition may be diagnosed based on:  An exam of the affected area.  Your medical history.  Whether anyone in your family has had this condition before.  A review of any medicines you have been taking.  Tests, including: ? Allergy skin tests to see if  the condition was caused by an allergic reaction. ? Blood tests to see if the condition was caused by a gene. ? Tests to check for underlying diseases that could cause the condition. How is this treated? Treatment for this condition depends on the cause. It may involve any of the following:  If something triggered the condition, making changes to keep it from triggering the condition again.  If the condition affects your breathing, having tubes placed in your airway to keep it open.  Taking medicines to treat symptoms or prevent future episodes. These may include: ? Antihistamines. ? Epinephrine injections. ? Steroids. If your condition is severe, you may need to be treated at the hospital. Angioedema usually gets better in 24-48 hours. Follow these instructions at home:  Take over-the-counter and prescription medicines only as told by your health care provider.  If you were given medicines for emergency allergy treatment, always carry them with you.  Wear a medical bracelet as told by your health care provider.  If something triggers your condition, avoid the trigger, if possible.  If your condition is inherited and you are thinking about having children, talk to your health care provider. It is important to discuss the risks of passing on the condition to your children. Contact a health care provider if:  You have repeated episodes of angioedema.  Episodes of angioedema start to happen more often than they used to, even after you take steps to prevent them.  You have episodes of angioedema that are more severe than they have been before, even after you take steps to prevent them.  You are thinking about having children. Get help right away if:  You have severe swelling of your mouth, tongue, or lips.  You have trouble breathing.  You have trouble swallowing.  You faint. This information is not intended to replace advice given to you by your health care provider. Make  sure you discuss any questions you have with your health care provider. Document Released: 11/16/2001 Document Revised: 08/20/2017 Document Reviewed: 03/17/2016 Elsevier Patient Education  2020 [employer].

## 2019-04-04 NOTE — Progress Notes (Signed)
Subjective: CC: Lip swelling PCP: Julie Norlander, DO ZMO:QHUTML Julie Morton is a 33 y.o. female presenting to clinic today for:  1.  Lip swelling Patient reports a 1 day history of left-sided lower lip swelling.  She notes that this started as a small bump on the inner lower aspect of the lip and then progressed to market lip swelling throughout the day.  She describes this as happening previously but resolving with a dose of Benadryl.  She is taken 50 mg of Benadryl as of 943 this morning but has not had resolution in the lip swelling.  Denies any new foods, new drinks, new lip products or moisturizers.  No injury to the lip.  She notes that she is undergone substantial testing in the past with the allergist in [place] but they were not able to determine why she was having lip swelling.  Denies any rash elsewhere on her body.  She did have some sensation of throat closing and shortness of breath earlier but that has since resolved.  She did also have facial swelling on the left lower part of her jaw but again that has resolved.   ROS: Per HPI  Allergies  Allergen Reactions  . Amoxicillin Hives and Other (See Comments)    Has patient had a PCN reaction causing immediate rash, facial/tongue/throat swelling, SOB or lightheadedness with hypotension: No Has patient had a PCN reaction causing severe rash involving mucus membranes or skin necrosis: No Has patient had a PCN reaction that required hospitalization No Has patient had a PCN reaction occurring within the last 10 years: Yes If all of the above answers are "NO", then may proceed with Cephalosporin use.   Marland Kitchen Penicillins Hives and Other (See Comments)    Has patient had a PCN reaction causing immediate rash, facial/tongue/throat swelling, SOB or lightheadedness with hypotension: No Has patient had a PCN reaction causing severe rash involving mucus membranes or skin necrosis: No Has patient had a PCN reaction that required  hospitalization No Has patient had a PCN reaction occurring within the last 10 years: Yes If all of the above answers are "NO", then may proceed with Cephalosporin use.   Past Medical History:  Diagnosis Date  . Medical history non-contributory     Current Outpatient Medications:  .  ibuprofen (ADVIL,MOTRIN) 600 MG tablet, Take 1 tablet (600 mg total) by mouth every 6 (six) hours., Disp: 30 tablet, Rfl: 0 .  levonorgestrel (MIRENA, 52 MG,) 20 MCG/24HR IUD, Mirena 20 mcg/24 hours (5 yrs) 52 mg intrauterine device  Take 1 device by intrauterine route., Disp: , Rfl:  .  naproxen (NAPROSYN) 500 MG tablet, Take 1 tablet (500 mg total) by mouth 2 (two) times daily with a meal., Disp: 60 tablet, Rfl: 1 .  loratadine (CLARITIN) 10 MG tablet, Take 10 mg by mouth daily as needed for allergies., Disp: , Rfl:  Social History   Socioeconomic History  . Marital status: Married    Spouse name: Not on file  . Number of children: Not on file  . Years of education: Not on file  . Highest education level: Not on file  Occupational History  . Not on file  Social Needs  . Financial resource strain: Not on file  . Food insecurity    Worry: Not on file    Inability: Not on file  . Transportation needs    Medical: Not on file    Non-medical: Not on file  Tobacco Use  . Smoking status:  Current Every Day Smoker    Packs/day: 0.50    Years: 10.00    Pack years: 5.00    Types: Cigarettes  . Smokeless tobacco: Never Used  Substance and Sexual Activity  . Alcohol use: No  . Drug use: No  . Sexual activity: Yes  Lifestyle  . Physical activity    Days per week: Not on file    Minutes per session: Not on file  . Stress: Not on file  Relationships  . Social Musicianconnections    Talks on phone: Not on file    Gets together: Not on file    Attends religious service: Not on file    Active member of club or organization: Not on file    Attends meetings of clubs or organizations: Not on file     Relationship status: Not on file  . Intimate partner violence    Fear of current or ex partner: Not on file    Emotionally abused: Not on file    Physically abused: Not on file    Forced sexual activity: Not on file  Other Topics Concern  . Not on file  Social History Narrative  . Not on file   Family History  Problem Relation Age of Onset  . Cervical cancer Maternal Grandmother   . Cancer Maternal Grandmother     Objective: Office vital signs reviewed. BP 128/80   Pulse 90   Temp 99.4 F (37.4 C) (Oral)   Ht 4\' 11"  (1.499 m)   Wt 174 lb (78.9 kg)   BMI 35.14 kg/m   Physical Examination:  General: Awake, alert, well nourished, No acute distress HEENT: She has gross swelling of the left side of the lower lip, no mucosal edema noted.  Oropharynx is patent. Pulmonary: Clear to auscultation bilaterally.  No wheezes, rhonchi or rales.  She has normal work of breathing on room air. Psych: mood stable. Speech normal     Depression screen [facility] 2/9 04/04/2019 11/02/2017 06/18/2016  Decreased Interest 0 0 0  Down, Depressed, Hopeless 0 0 0  PHQ - 2 Score 0 0 0  Altered sleeping 0 - -  Tired, decreased energy 0 - -  Change in appetite 0 - -  Feeling bad or failure about yourself  0 - -  Trouble concentrating 0 - -  Moving slowly or fidgety/restless 0 - -  Suicidal thoughts 0 - -  PHQ-9 Score 0 - -   Assessment/ Plan: 33 y.o. female   1. Lip swelling I question hereditary angioedema given reports of extensive allergy work-up with no etiology of intermittent lip swelling.  She particularly feels that this is related to stress and therefore we started Prozac as below.  I have given her prednisone Dosepak to start tomorrow should she has persistent lip swelling.  I also sent in Atarax to have on hand should she need for allergy induced symptoms.  She was given a dose of Depo-Medrol 80 here in office as well as Benadryl 25 mg IM.  Caution sedation.  She will go home and rest  immediately.  We discussed reasons for emergent evaluation emergency department and she voiced good understanding. - diphenhydrAMINE (BENADRYL) injection 25 mg - EPINEPHrine (EPIPEN 2-PAK) 0.3 mg/0.3 mL IJ SOAJ injection; Inject 0.3 mLs (0.3 mg total) into the muscle as needed for anaphylaxis. Then go to ER  Dispense: 1 each; Refill: 0 - predniSONE (STERAPRED UNI-PAK 21 TAB) 10 MG (21) TBPK tablet; As directed x 6 days  Dispense:  21 tablet; Refill: 0 - hydrOXYzine (ATARAX/VISTARIL) 50 MG tablet; Take 0.5-1 tablets (25-50 mg total) by mouth 3 (three) times daily as needed ((allergic reaction)).  Dispense: 30 tablet; Refill: 0  2. Stress at work Follow-up in 6 weeks for recheck.  Discussed potential side effects of medication. - FLUoxetine (PROZAC) 20 MG capsule; Take 1 capsule (20 mg total) by mouth daily.  Dispense: 30 capsule; Refill: 1  No orders of the defined types were placed in this encounter.  No orders of the defined types were placed in this encounter.    Raliegh IpAshly M Gottschalk, DO [facility] [phone]

## 2019-04-06 ENCOUNTER — Encounter: Payer: Self-pay | Admitting: Family Medicine

## 2019-04-06 ENCOUNTER — Telehealth: Payer: Self-pay | Admitting: Family Medicine

## 2019-04-06 NOTE — Telephone Encounter (Signed)
Pt aware that Dr. Darnell Level is not here today and will call her on 04/07/19

## 2019-05-04 ENCOUNTER — Other Ambulatory Visit: Payer: Self-pay | Admitting: Family Medicine

## 2019-05-04 DIAGNOSIS — M65342 Trigger finger, left ring finger: Secondary | ICD-10-CM

## 2019-05-05 ENCOUNTER — Ambulatory Visit (INDEPENDENT_AMBULATORY_CARE_PROVIDER_SITE_OTHER): Payer: Commercial Managed Care - PPO | Admitting: Family Medicine

## 2019-05-05 ENCOUNTER — Encounter: Payer: Self-pay | Admitting: Family Medicine

## 2019-05-05 DIAGNOSIS — J029 Acute pharyngitis, unspecified: Secondary | ICD-10-CM | POA: Diagnosis not present

## 2019-05-05 DIAGNOSIS — R6889 Other general symptoms and signs: Secondary | ICD-10-CM

## 2019-05-05 DIAGNOSIS — Z20822 Contact with and (suspected) exposure to covid-19: Secondary | ICD-10-CM

## 2019-05-05 DIAGNOSIS — R197 Diarrhea, unspecified: Secondary | ICD-10-CM

## 2019-05-05 NOTE — Progress Notes (Signed)
Virtual Visit via telephone Note Due to COVID-19 pandemic this visit was conducted virtually. This visit type was conducted due to national recommendations for restrictions regarding the COVID-19 Pandemic (e.g. social distancing, sheltering in place) in an effort to limit this patient's exposure and mitigate transmission in our community. All issues noted in this document were discussed and addressed.  A physical exam was not performed with this format.   I connected with Julie Morton on 05/05/19 at 1615 by telephone and verified that I am speaking with the correct person using two identifiers. Julie Morton is currently located at home and family is currently with them during visit. The provider, Kari BaarsMichelle Carry Weesner, FNP is located in their office at time of visit.  I discussed the limitations, risks, security and privacy concerns of performing an evaluation and management service by telephone and the availability of in person appointments. I also discussed with the patient that there may be a patient responsible charge related to this service. The patient expressed understanding and agreed to proceed.  Subjective:  Patient ID: Julie Morton, female    DOB: [birth year], 33 y.o.   MRN: [id number]  Chief Complaint:  Fatigue, Diarrhea, and Sore Throat   HPI: Julie Morton is a 33 y.o. female presenting on 05/05/2019 for Fatigue, Diarrhea, and Sore Throat   Pt presents today with complaints of not feeling well. Pt states she started feeling bad on 05/03/2019. States she hs fatigue, headache, congestion, sore throat, and diarrhea. She has not measured her temperature. States she has been in contact with coworkers that have been sick, unknown if COVID-19 positive. Pt states she has tried tylenol and fluids without relief of symptoms.   Diarrhea  This is a new problem. The current episode started in the past 7 days. The problem occurs 2 to 4 times per day. The problem has been waxing and waning. The  stool consistency is described as watery. The patient states that diarrhea does not awaken her from sleep. Associated symptoms include chills, coughing, headaches, myalgias and a URI. Pertinent negatives include no abdominal pain, arthralgias, bloating, fever, increased  flatus, sweats, vomiting or weight loss. Risk factors include ill contacts. She has tried analgesics and increased fluids for the symptoms. The treatment provided no relief.  Sore Throat  The current episode started in the past 7 days. The problem has been waxing and waning. Neither side of throat is experiencing more pain than the other. The pain is at a severity of 3/10. The pain is mild. Associated symptoms include congestion, coughing, diarrhea and headaches. Pertinent negatives include no abdominal pain, drooling, ear discharge, ear pain, hoarse voice, plugged ear sensation, neck pain, shortness of breath, stridor, swollen glands, trouble swallowing or vomiting. She has tried acetaminophen for the symptoms. The treatment provided no relief.     Relevant past medical, surgical, family, and social history reviewed and updated as indicated.  Allergies and medications reviewed and updated.   Past Medical History:  Diagnosis Date   Medical history non-contributory     Past Surgical History:  Procedure Laterality Date   NO PAST SURGERIES      Social History   Socioeconomic History   Marital status: Married    Spouse name: Not on file   Number of children: Not on file   Years of education: Not on file   Highest education level: Not on file  Occupational History   Not on file  Social Needs   Financial resource strain: Not  on file   Food insecurity    Worry: Not on file    Inability: Not on file   Transportation needs    Medical: Not on file    Non-medical: Not on file  Tobacco Use   Smoking status: Current Every Day Smoker    Packs/day: 0.50    Years: 10.00    Pack years: 5.00    Types: Cigarettes     Smokeless tobacco: Never Used  Substance and Sexual Activity   Alcohol use: No   Drug use: No   Sexual activity: Yes  Lifestyle   Physical activity    Days per week: Not on file    Minutes per session: Not on file   Stress: Not on file  Relationships   Social connections    Talks on phone: Not on file    Gets together: Not on file    Attends religious service: Not on file    Active member of club or organization: Not on file    Attends meetings of clubs or organizations: Not on file    Relationship status: Not on file   Intimate partner violence    Fear of current or ex partner: Not on file    Emotionally abused: Not on file    Physically abused: Not on file    Forced sexual activity: Not on file  Other Topics Concern   Not on file  Social History Narrative   Not on file    Outpatient Encounter Medications as of 05/05/2019  Medication Sig   EPINEPHrine (EPIPEN 2-PAK) 0.3 mg/0.3 mL IJ SOAJ injection Inject 0.3 mLs (0.3 mg total) into the muscle as needed for anaphylaxis. Then go to ER   FLUoxetine (PROZAC) 20 MG capsule Take 1 capsule (20 mg total) by mouth daily.   hydrOXYzine (ATARAX/VISTARIL) 50 MG tablet Take 0.5-1 tablets (25-50 mg total) by mouth 3 (three) times daily as needed ((allergic reaction)).   levonorgestrel (MIRENA, 52 MG,) 20 MCG/24HR IUD Mirena 20 mcg/24 hours (5 yrs) 52 mg intrauterine device  Take 1 device by intrauterine route.   loratadine (CLARITIN) 10 MG tablet Take 10 mg by mouth daily as needed for allergies.   predniSONE (STERAPRED UNI-PAK 21 TAB) 10 MG (21) TBPK tablet As directed x 6 days   No facility-administered encounter medications on file as of 05/05/2019.     Allergies  Allergen Reactions   Amoxicillin Hives and Other (See Comments)    Has patient had a PCN reaction causing immediate rash, facial/tongue/throat swelling, SOB or lightheadedness with hypotension: No Has patient had a PCN reaction causing severe rash  involving mucus membranes or skin necrosis: No Has patient had a PCN reaction that required hospitalization No Has patient had a PCN reaction occurring within the last 10 years: Yes If all of the above answers are "NO", then may proceed with Cephalosporin use.    Penicillins Hives and Other (See Comments)    Has patient had a PCN reaction causing immediate rash, facial/tongue/throat swelling, SOB or lightheadedness with hypotension: No Has patient had a PCN reaction causing severe rash involving mucus membranes or skin necrosis: No Has patient had a PCN reaction that required hospitalization No Has patient had a PCN reaction occurring within the last 10 years: Yes If all of the above answers are "NO", then may proceed with Cephalosporin use.    Review of Systems  Constitutional: Positive for activity change, appetite change, chills and fatigue. Negative for diaphoresis, fever, unexpected weight change and weight  loss.  HENT: Positive for congestion and sore throat. Negative for dental problem, drooling, ear discharge, ear pain, facial swelling, hearing loss, hoarse voice, mouth sores, nosebleeds, postnasal drip, rhinorrhea, sinus pressure, sinus pain, sneezing, tinnitus, trouble swallowing and voice change.   Eyes: Negative for photophobia and visual disturbance.  Respiratory: Positive for cough. Negative for chest tightness, shortness of breath, wheezing and stridor.   Cardiovascular: Negative for chest pain, palpitations and leg swelling.  Gastrointestinal: Positive for diarrhea. Negative for abdominal distention, abdominal pain, anal bleeding, bloating, blood in stool, flatus and vomiting.  Genitourinary: Negative for decreased urine volume and difficulty urinating.  Musculoskeletal: Positive for myalgias. Negative for arthralgias and neck pain.  Neurological: Positive for headaches. Negative for dizziness, syncope, weakness and light-headedness.  Psychiatric/Behavioral: Negative for  confusion.  All other systems reviewed and are negative.        Observations/Objective: No vital signs or physical exam, this was a telephone or virtual health encounter.  Pt alert and oriented, answers all questions appropriately, and able to speak in full sentences.    Assessment and Plan: Merry ProudBrandi was seen today for fatigue, diarrhea and sore throat.  Diagnoses and all orders for this visit:  Suspected Covid-19 Virus Infection Reported symptoms concerning for COVID-19 infection. Symptomatic care and self quarantine discussed. Pt aware to report any new or worsening symptoms. Testing ordered. Work not sent to [facility]. -     Novel Coronavirus, NAA ([facility]) -     MyChart COVID-19 home monitoring program; Future -     Temperature monitoring; Future     Follow Up Instructions: Return if symptoms worsen or fail to improve.    I discussed the assessment and treatment plan with the patient. The patient was provided an opportunity to ask questions and all were answered. The patient agreed with the plan and demonstrated an understanding of the instructions.   The patient was advised to call back or seek an in-person evaluation if the symptoms worsen or if the condition fails to improve as anticipated.  The above assessment and management plan was discussed with the patient. The patient verbalized understanding of and has agreed to the management plan. Patient is aware to call the clinic if symptoms persist or worsen. Patient is aware when to return to the clinic for a follow-up visit. Patient educated on when it is appropriate to go to the emergency department.    I provided 15 minutes of non-face-to-face time during this encounter. The call started at 1615. The call ended at 1630. The other time was used for coordination of care.    Kari BaarsMichelle Negin Hegg, FNP-C [facility] [address] [place], [place] [address] [phone] 05/05/19

## 2019-05-06 ENCOUNTER — Encounter (INDEPENDENT_AMBULATORY_CARE_PROVIDER_SITE_OTHER): Payer: Self-pay

## 2019-05-07 ENCOUNTER — Encounter (INDEPENDENT_AMBULATORY_CARE_PROVIDER_SITE_OTHER): Payer: Self-pay

## 2019-05-08 ENCOUNTER — Encounter (INDEPENDENT_AMBULATORY_CARE_PROVIDER_SITE_OTHER): Payer: Self-pay

## 2019-05-08 ENCOUNTER — Telehealth: Payer: Self-pay | Admitting: Family Medicine

## 2019-05-08 ENCOUNTER — Other Ambulatory Visit: Payer: Self-pay

## 2019-05-08 DIAGNOSIS — Z20822 Contact with and (suspected) exposure to covid-19: Secondary | ICD-10-CM

## 2019-05-08 NOTE — Telephone Encounter (Signed)
Pt tested today for Covid Per pt, her employer said as long as she is not symptomatic for 72 hrs, she may return to work. Pt states she is still fatigued and has a sore throat but employer said she could come back to work on [place]  I explained to pt we can give work note, pt declined

## 2019-05-09 ENCOUNTER — Encounter (INDEPENDENT_AMBULATORY_CARE_PROVIDER_SITE_OTHER): Payer: Self-pay

## 2019-05-09 ENCOUNTER — Telehealth: Payer: Self-pay | Admitting: *Deleted

## 2019-05-09 LAB — NOVEL CORONAVIRUS, NAA: SARS-CoV-2, NAA: NOT DETECTED

## 2019-05-09 NOTE — Telephone Encounter (Signed)
Contacted pt due to Henry response 05/09/2019 diarrhea; the pt says that she had 1 episode of soft stool; recommendations: Staying well-hydrated is the key for adults with diarrhea. From what you have told me, it sounds like you are not severely dehydrated at this point.  * Here is some general care advice that should help.    3: FLUID THERAPY DURING MILD-MODERATE DIARRHEA:  * Drink more fluids, at least 8-10 cups daily. One cup equals 8 oz (240 ml).  * WATER: For mild to moderate diarrhea, water is often the best liquid to drink. You should also eat some salty foods (e.g., potato chips, pretzels, saltine crackers). This is important to make sure you are getting enough salt, sugars, and fluids to meet your body's needs.  * SPORTS DRINKS: You can also drink a sports drinks (e.g., Gatorade, Powerade) to help treat and prevent dehydration. For it to work best, mix it half and half with water.  * Avoid caffeinated beverages (Reason: caffeine is mildly dehydrating).  * Avoid alcohol beverages (beer, wine, hard liquor).   4: FOOD AND NUTRITION DURING MILD-MODERATE DIARRHEA  * Maintaining some food intake during episodes of diarrhea is important.  * Begin with boiled starches / cereals (e.g., potatoes, rice, noodles, wheat, oats) with a small amount of salt to taste.  * Other foods that are OK include: bananas, yogurt, crackers, soup.  * As the diarrhea starts to get better, you can slowly return to a normal diet.   5: DIARRHEA MEDICINE - Loperamide (Imodium AD):  * This medicine helps decrease diarrhea. It is available over-the-counter (OTC) in a drug store.  * Adult dosage: 4 mg (2 capsules) is the recommended first dose. You may take an additional 2 mg (1 capsule) after each loose BM.  * Maximum dosage: 16 mg per day (8 capsules).  * Do not use for more than 2 days.   6: CAUTION - Loperamide (Imodium AD):  * DO NOT use if there is a fever over 100.4 F (38.0 C) or if there is blood or mucus  in the stools.  * Read and follow the package instructions carefully.   7: DIARRHEA MEDICINE - Bismuth Subsalicylate (e.g., Kaopectate, Pepto-Bismol):  * This medicine can help reduce diarrhea, vomiting, and abdominal cramping. It is available over-the-counter (OTC) in a drug store.  * Adult dosage: Take two tablets or two tablespoons by mouth every hour (if diarrhea continues) to a maximum of 8 doses in a 24 hour period.  * Do not use for more than 2 days.   8: CAUTION - Bismuth Subsalicylate (e.g., Kaopectate, Pepto-Bismol):  * May cause a temporary darkening of stool and tongue.  * Do not use if allergic to aspirin.  * Do not use in pregnancy.  * Read and follow the package instructions carefully.   9: CONTAGIOUSNESS:  * Be certain to wash your hands after using the restroom.  * If your work is cooking, Research scientist (medical), serving or preparing food, then you should not work until the diarrhea has completely stopped.   10: EXPECTED COURSE: Viral diarrhea lasts 4-7 days. Always worse on days 1 and 2.   11: CALL BACK IF:  * Signs of dehydration occur (e.g., no urine over 12 hours, very dry mouth, lightheaded, etc.)  * Diarrhea lasts over 7 days  * You become worse.   12: CARE ADVICE given per Diarrhea (Adult) guideline.  Pt also advised to contact her PCP for further recommendations; she verbalized understanding.

## 2019-05-22 ENCOUNTER — Encounter: Payer: Self-pay | Admitting: Family Medicine

## 2019-05-28 ENCOUNTER — Other Ambulatory Visit: Payer: Self-pay | Admitting: Family Medicine

## 2019-05-28 DIAGNOSIS — Z566 Other physical and mental strain related to work: Secondary | ICD-10-CM

## 2019-05-30 ENCOUNTER — Encounter: Payer: Self-pay | Admitting: Family Medicine

## 2019-05-30 ENCOUNTER — Ambulatory Visit (INDEPENDENT_AMBULATORY_CARE_PROVIDER_SITE_OTHER): Payer: Commercial Managed Care - PPO | Admitting: Family Medicine

## 2019-05-30 DIAGNOSIS — R220 Localized swelling, mass and lump, head: Secondary | ICD-10-CM | POA: Diagnosis not present

## 2019-05-30 DIAGNOSIS — F411 Generalized anxiety disorder: Secondary | ICD-10-CM | POA: Diagnosis not present

## 2019-05-30 MED ORDER — FLUOXETINE HCL 40 MG PO CAPS: 40.0000 mg | ORAL_CAPSULE | Freq: Every day | ORAL | 1 refills | Status: DC

## 2019-05-30 NOTE — Progress Notes (Signed)
Telephone visit  Subjective: CC: f/u GAD/ stress PCP: Julie Morton, Julie Liuzzi Morton, Julie Morton ZOX:[id number]:Julie Morton is a 33 y.o. female calls for telephone consult today. Patient provides verbal consent for consult held via phone.  Location of patient: car Location of provider: [facility] Others present for call: none  1.  Anxiety disorder/stress She was seen on 04/04/2019 and start on Prozac 20 mg daily and given atarax for prn use.  She notes that she does seem to be doing quite a bit better on the Prozac 20 mg daily but states that she has had increased stressors with family.  A family member recently had to undergo stat heart surgery and she will be caring for him for the next 4 weeks.  She notes that she had an episode of lip swelling earlier this week that did improve after taking Benadryl.   ROS: Per HPI  Allergies  Allergen Reactions  . Amoxicillin Hives and Other (See Comments)    Has patient had a PCN reaction causing immediate rash, facial/tongue/throat swelling, SOB or lightheadedness with hypotension: No Has patient had a PCN reaction causing severe rash involving mucus membranes or skin necrosis: No Has patient had a PCN reaction that required hospitalization No Has patient had a PCN reaction occurring within the last 10 years: Yes If all of the above answers are "NO", then may proceed with Cephalosporin use.   Marland Kitchen. Penicillins Hives and Other (See Comments)    Has patient had a PCN reaction causing immediate rash, facial/tongue/throat swelling, SOB or lightheadedness with hypotension: No Has patient had a PCN reaction causing severe rash involving mucus membranes or skin necrosis: No Has patient had a PCN reaction that required hospitalization No Has patient had a PCN reaction occurring within the last 10 years: Yes If all of the above answers are "NO", then may proceed with Cephalosporin use.   Past Medical History:  Diagnosis Date  . Medical history non-contributory     Current Outpatient  Medications:  .  EPINEPHrine (EPIPEN 2-PAK) 0.3 mg/0.3 mL IJ SOAJ injection, Inject 0.3 mLs (0.3 mg total) into the muscle as needed for anaphylaxis. Then go to ER, Disp: 1 each, Rfl: 0 .  FLUoxetine (PROZAC) 40 MG capsule, Take 1 capsule (40 mg total) by mouth daily., Disp: 30 capsule, Rfl: 1 .  hydrOXYzine (ATARAX/VISTARIL) 50 MG tablet, Take 0.5-1 tablets (25-50 mg total) by mouth 3 (three) times daily as needed ((allergic reaction))., Disp: 30 tablet, Rfl: 0 .  levonorgestrel (MIRENA, 52 MG,) 20 MCG/24HR IUD, Mirena 20 mcg/24 hours (5 yrs) 52 mg intrauterine device  Take 1 device by intrauterine route., Disp: , Rfl:  .  loratadine (CLARITIN) 10 MG tablet, Take 10 mg by mouth daily as needed for allergies., Disp: , Rfl:    Assessment/ Plan: 33 y.o. female   1. Generalized anxiety disorder Still has some breakthrough anxiety and therefore we have increased the fluoxetine to 40 mg daily.  She will follow-up in 6 weeks by tele-visit - FLUoxetine (PROZAC) 40 MG capsule; Take 1 capsule (40 mg total) by mouth daily.  Dispense: 30 capsule; Refill: 1  2. Lip swelling After much more taking it appears that she had work-up locally for allergy/angioedema.  To my knowledge it does not seem to have resulted in any significant findings but she continues to have intermittent lip swelling and various finger swellings that are unusual.  Most often they seem to be relieved by antihistamines.  I think that she deserves further work-up and therefore I  have placed a referral to an allergy specialist at an academic center. - Ambulatory referral to Allergy   Start time: 4:16pm End time: 4:25pm  Total time spent on patient care (including telephone call/ virtual visit): 19 minutes  [place], [place] [phone]

## 2019-06-06 ENCOUNTER — Encounter: Payer: Self-pay | Admitting: Family Medicine

## 2019-06-26 ENCOUNTER — Other Ambulatory Visit: Payer: Self-pay | Admitting: Family Medicine

## 2019-06-26 DIAGNOSIS — M65342 Trigger finger, left ring finger: Secondary | ICD-10-CM

## 2019-07-31 ENCOUNTER — Other Ambulatory Visit: Payer: Self-pay | Admitting: Family Medicine

## 2019-07-31 DIAGNOSIS — F411 Generalized anxiety disorder: Secondary | ICD-10-CM

## 2019-08-21 ENCOUNTER — Encounter: Payer: Self-pay | Admitting: Family Medicine

## 2019-08-21 MED ORDER — CLINDAMYCIN HCL 300 MG PO CAPS: 300.0000 mg | ORAL_CAPSULE | Freq: Three times a day (TID) | ORAL | 0 refills | Status: DC

## 2019-09-06 ENCOUNTER — Encounter: Payer: Self-pay | Admitting: Family Medicine

## 2019-09-07 ENCOUNTER — Other Ambulatory Visit: Payer: Self-pay

## 2019-09-07 ENCOUNTER — Other Ambulatory Visit: Payer: Self-pay | Admitting: Family Medicine

## 2019-09-07 ENCOUNTER — Ambulatory Visit (INDEPENDENT_AMBULATORY_CARE_PROVIDER_SITE_OTHER): Payer: Commercial Managed Care - PPO | Admitting: Family

## 2019-09-07 ENCOUNTER — Encounter: Payer: Self-pay | Admitting: Family

## 2019-09-07 DIAGNOSIS — K0889 Other specified disorders of teeth and supporting structures: Secondary | ICD-10-CM

## 2019-09-07 MED ORDER — CLINDAMYCIN HCL 300 MG PO CAPS: 300.0000 mg | ORAL_CAPSULE | Freq: Three times a day (TID) | ORAL | 0 refills | Status: DC

## 2019-09-07 NOTE — Progress Notes (Signed)
   Virtual Visit via telephone Note Due to COVID-19 pandemic this visit was conducted virtually. This visit type was conducted due to national recommendations for restrictions regarding the COVID-19 Pandemic (e.g. social distancing, sheltering in place) in an effort to limit this patient's exposure and mitigate transmission in our community. All issues noted in this document were discussed and addressed.  A physical exam was not performed with this format.  I connected with Julie Morton on 09/07/19 at 12:26 pm by telephone and verified that I am speaking with the correct person using two identifiers. Julie Morton is currently located at home and no one is currently with her  during visit. The provider, Evelina Dun, FNP is located in their office at time of visit.  I discussed the limitations, risks, security and privacy concerns of performing an evaluation and management service by telephone and the availability of in person appointments. I also discussed with the patient that there may be a patient responsible charge related to this service. The patient expressed understanding and agreed to proceed.   History and Present Illness:  HPI  PT calls the office today with  left lower dental pain that started a few weeks ago. She called the office and was started on Clindamycin and felts better, but since stopping it she states she felt it "break" and now her pain is worse. She called her dentist and was told it was going to be $300 to fix it. She is trying to wait til after Christmas to get this done.   Denies any SOB, troubling swallowing.   Review of Systems  All other systems reviewed and are negative.    Observations/Objective: No SOB or distress noted   Assessment and Plan: 1. Pain, dental Keep follow up with dentist Report any increase in s/s of infection Can not refill antibiotics after this round - clindamycin (CLEOCIN) 300 MG capsule; Take 1 capsule (300 mg total) by mouth 3  (three) times daily.  Dispense: 30 capsule; Refill:0    I discussed the assessment and treatment plan with the patient. The patient was provided an opportunity to ask questions and all were answered. The patient agreed with the plan and demonstrated an understanding of the instructions.   The patient was advised to call back or seek an in-person evaluation if the symptoms worsen or if the condition fails to improve as anticipated.  The above assessment and management plan was discussed with the patient. The patient verbalized understanding of and has agreed to the management plan. Patient is aware to call the clinic if symptoms persist or worsen. Patient is aware when to return to the clinic for a follow-up visit. Patient educated on when it is appropriate to go to the emergency department.   Time call ended:  12:38 pm  I provided 12 minutes of non-face-to-face time during this encounter.    Evelina Dun, FNP

## 2019-10-08 ENCOUNTER — Encounter: Payer: Self-pay | Admitting: Family Medicine

## 2019-10-09 ENCOUNTER — Other Ambulatory Visit: Payer: Self-pay | Admitting: Family Medicine

## 2019-10-09 DIAGNOSIS — F411 Generalized anxiety disorder: Secondary | ICD-10-CM

## 2019-10-09 MED ORDER — FLUOXETINE HCL 40 MG PO CAPS: 40.0000 mg | ORAL_CAPSULE | Freq: Every day | ORAL | 3 refills | Status: DC

## 2019-11-27 IMAGING — DX LEFT HAND - COMPLETE 3+ VIEW
3 series · 3 of 3 positions shown · non-contrast
Comparison: None.

CLINICAL DATA: Acute onset of LEFT hand pain and swelling. No known
injury.

EXAM:
LEFT HAND - COMPLETE 3+ VIEW

[hand pa]
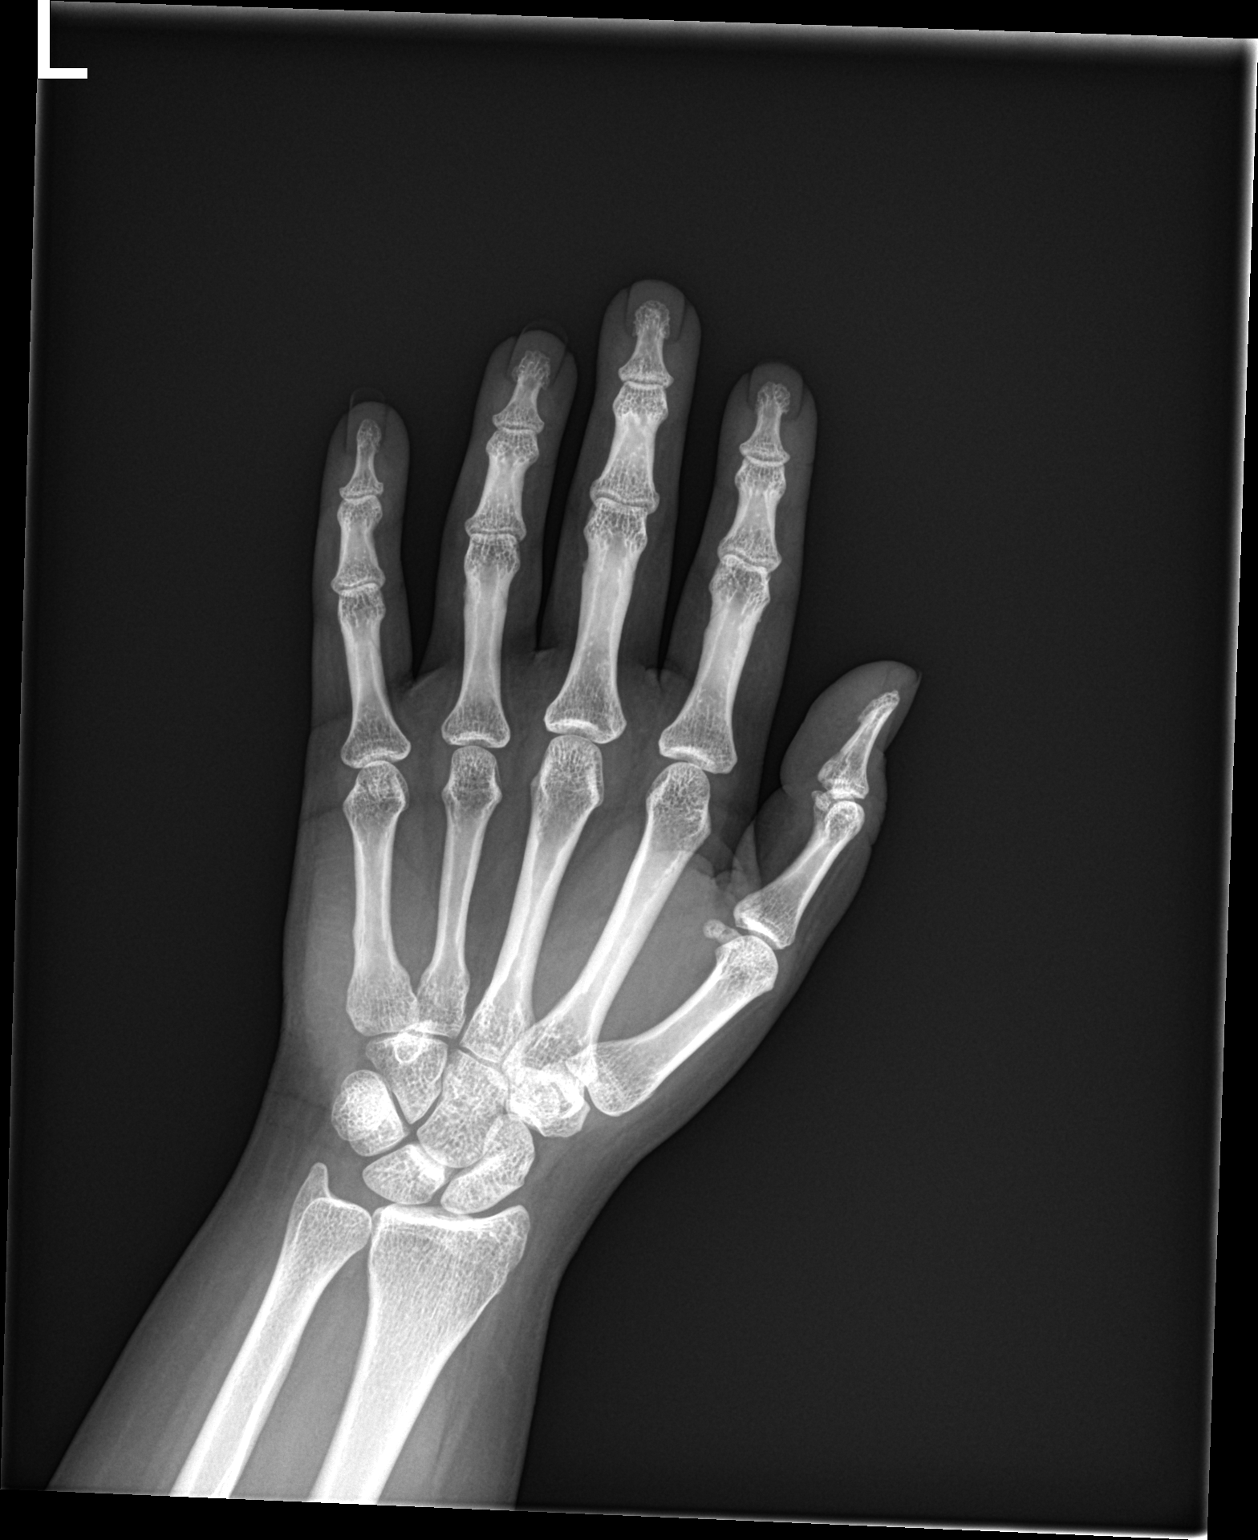

[hand obl]
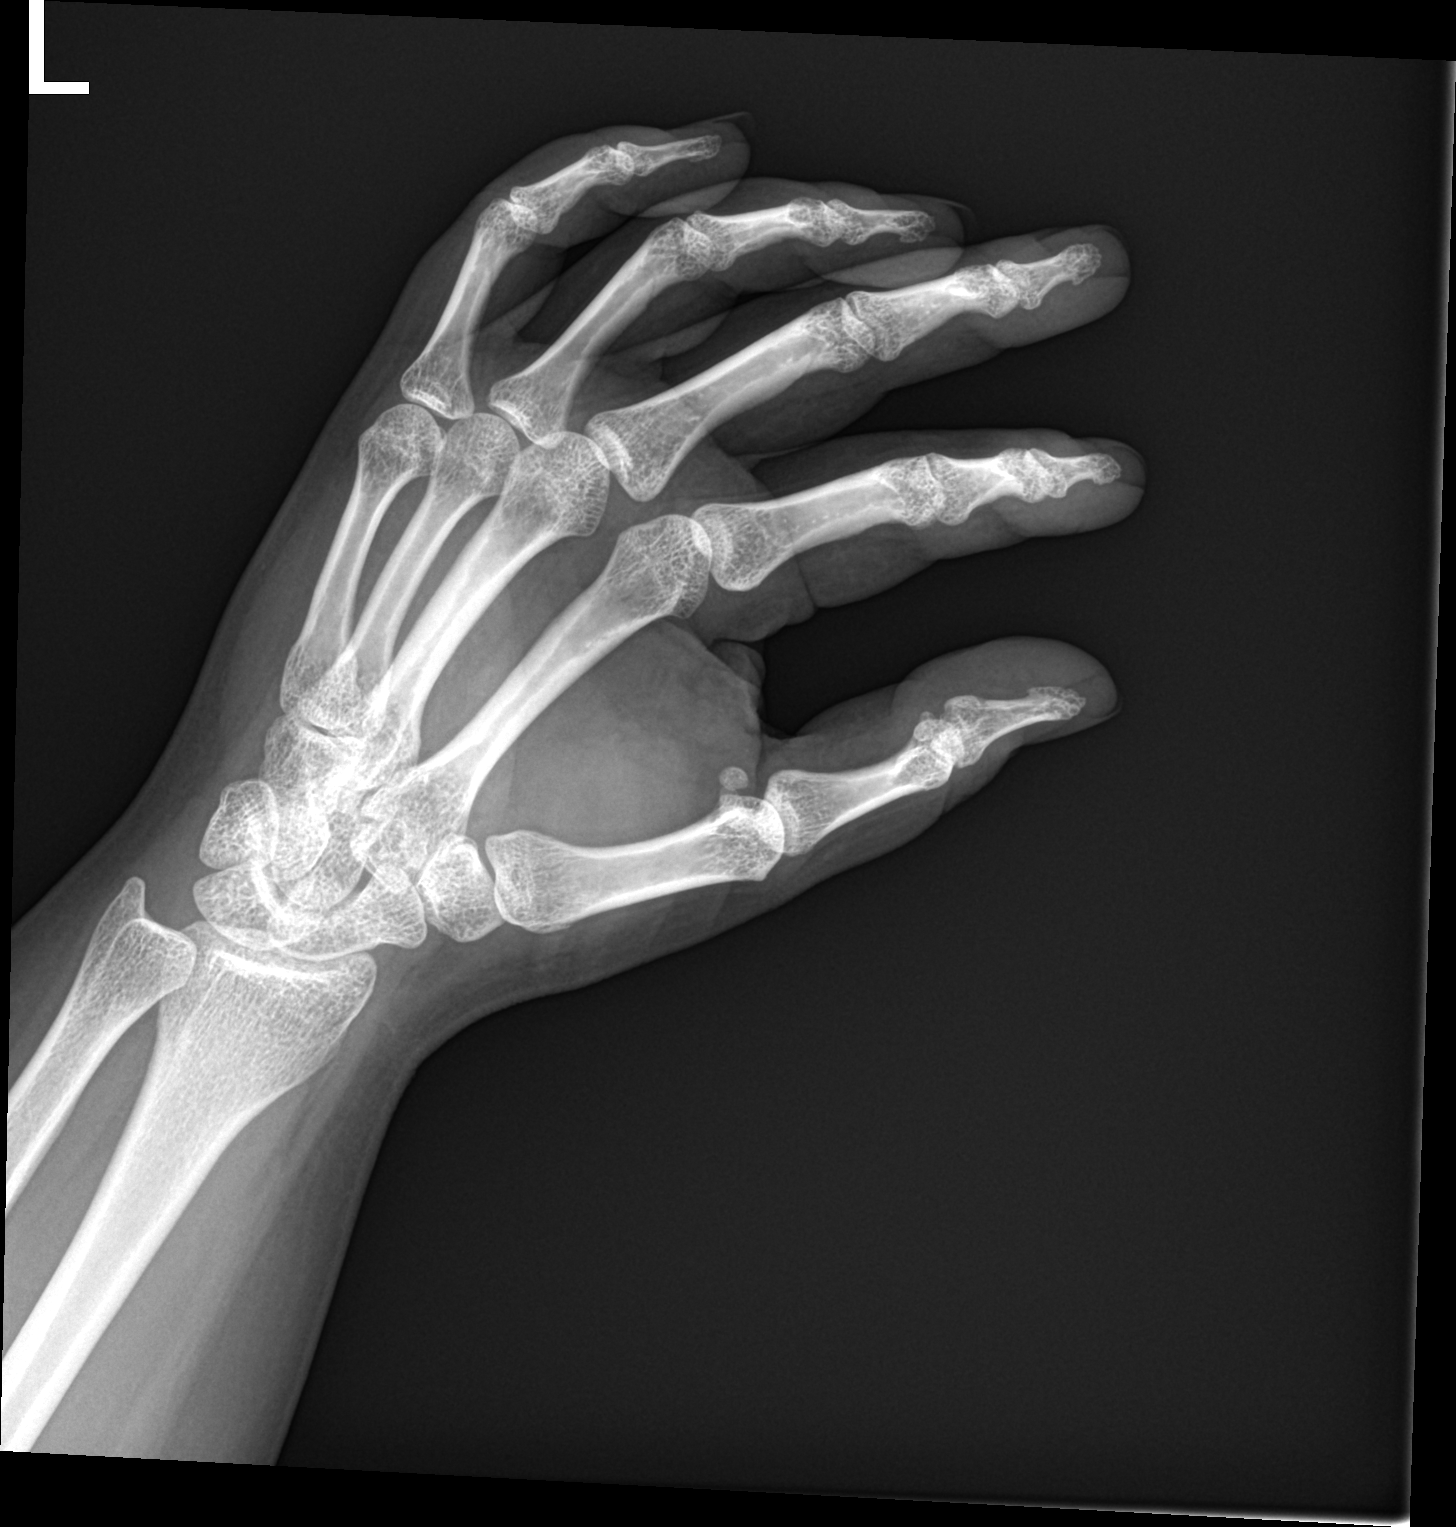

[hand lat]
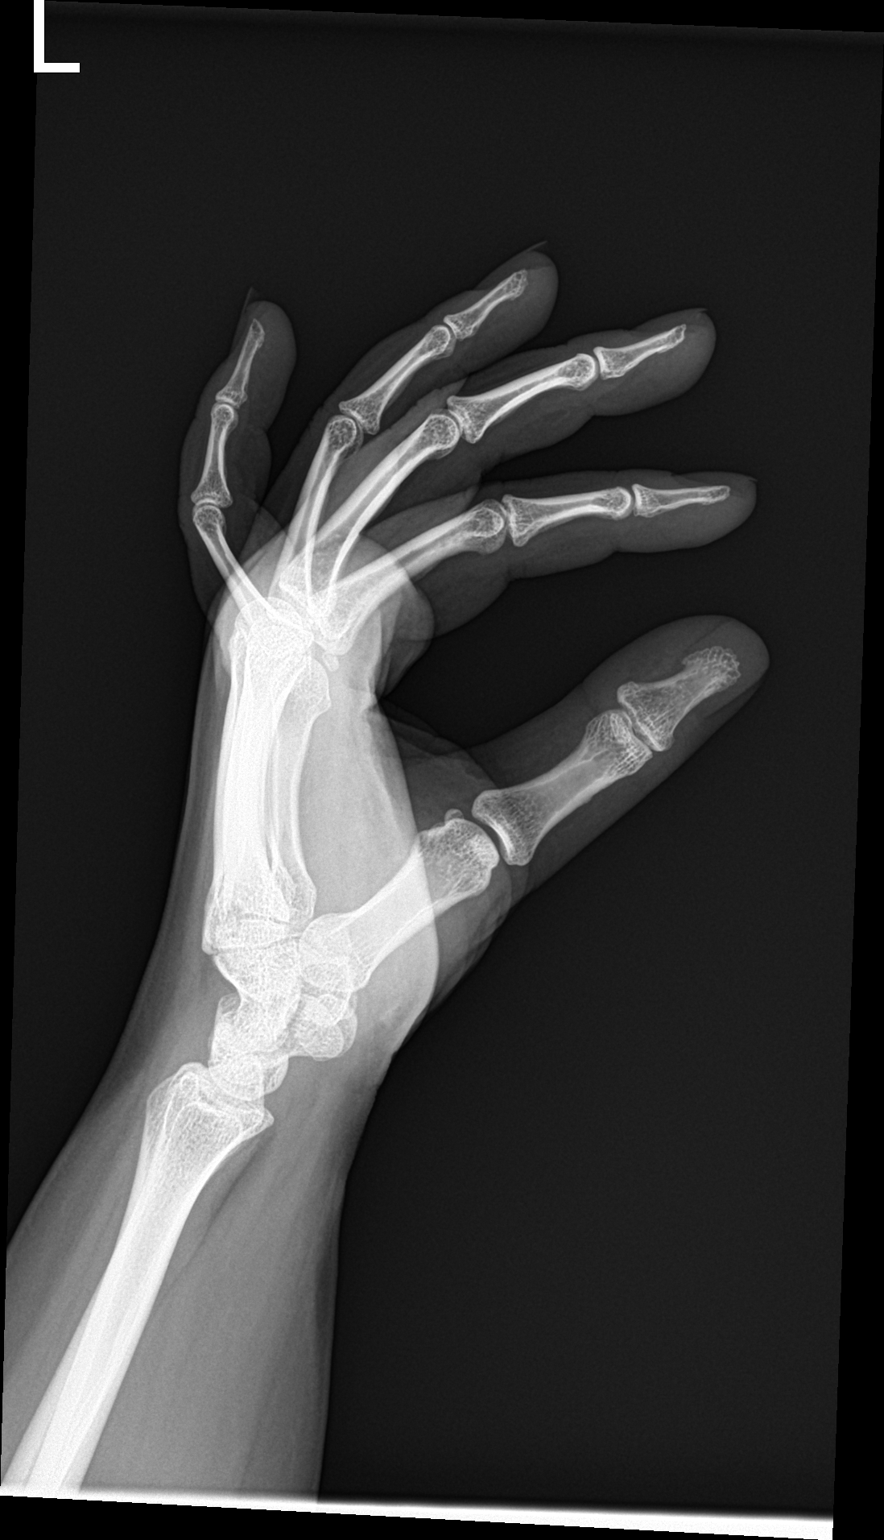

[3 of 3 positions shown; findings below may reference images not displayed]

FINDINGS: No evidence of acute fracture or dislocation. Joint spaces well
preserved. Well-preserved bone mineral density. No intrinsic osseous
abnormalities.
IMPRESSION: Normal examination.

## 2020-09-01 ENCOUNTER — Encounter: Payer: Self-pay | Admitting: Family Medicine

## 2020-10-10 ENCOUNTER — Telehealth: Payer: Self-pay | Admitting: Family Medicine

## 2020-10-10 NOTE — Telephone Encounter (Signed)
Pt tested positive for COVID on Tuesday 1/18, on Wednesday 1/19 she started to have a rash that was warm to the touch and itching. Has tried taking benadryl and using lotion. No new detergents, soaps or foods that she could think of. Symptoms with COVID - body ache, headache, stomach pain. Would like to see if there is anything else that she can do for the rash.

## 2020-10-10 NOTE — Telephone Encounter (Signed)
Red bumps on hands and feet. No pain. Just itching. Pt took Benadryl last night with no relief.  Instructed patient to use HC cream OTC, cool baths and continue Benadryl.  Offered to schedule an appt and pt did not want to at this time.

## 2020-10-11 ENCOUNTER — Encounter: Payer: Self-pay | Admitting: Family Medicine

## 2020-10-25 ENCOUNTER — Encounter: Payer: Self-pay | Admitting: Family Medicine

## 2020-10-25 DIAGNOSIS — F411 Generalized anxiety disorder: Secondary | ICD-10-CM

## 2020-10-25 MED ORDER — FLUOXETINE HCL 40 MG PO CAPS: 40.0000 mg | ORAL_CAPSULE | Freq: Every day | ORAL | 0 refills | Status: DC

## 2020-11-04 MED ORDER — FLUOXETINE HCL 40 MG PO CAPS: 40.0000 mg | ORAL_CAPSULE | Freq: Every day | ORAL | 0 refills | Status: DC

## 2020-11-04 NOTE — Addendum Note (Signed)
Addended by: Ignacia Bayley on: 11/04/2020 04:37 PM   Modules accepted: Orders

## 2020-11-15 ENCOUNTER — Encounter: Payer: Self-pay | Admitting: Family Medicine

## 2020-11-15 ENCOUNTER — Other Ambulatory Visit: Payer: Self-pay

## 2020-11-15 ENCOUNTER — Ambulatory Visit (INDEPENDENT_AMBULATORY_CARE_PROVIDER_SITE_OTHER): Payer: Managed Care, Other (non HMO) | Admitting: Family Medicine

## 2020-11-15 VITALS — BP 110/71 | HR 98 | Temp 97.1°F | Ht 59.0 in | Wt 158.0 lb

## 2020-11-15 DIAGNOSIS — H9193 Unspecified hearing loss, bilateral: Secondary | ICD-10-CM

## 2020-11-15 DIAGNOSIS — R220 Localized swelling, mass and lump, head: Secondary | ICD-10-CM | POA: Diagnosis not present

## 2020-11-15 DIAGNOSIS — R5383 Other fatigue: Secondary | ICD-10-CM | POA: Diagnosis not present

## 2020-11-15 DIAGNOSIS — F411 Generalized anxiety disorder: Secondary | ICD-10-CM

## 2020-11-15 MED ORDER — FLUOXETINE HCL 20 MG PO CAPS: 20.0000 mg | ORAL_CAPSULE | Freq: Every day | ORAL | 3 refills | Status: AC

## 2020-11-15 NOTE — Patient Instructions (Signed)
Schedule pap smear with OB  Referral for hearing placed  Prozac sent. Let me know if we need to increase dose again  You had labs performed today.  You will be contacted with the results of the labs once they are available, usually in the next 3 business days for routine lab work.  If you have an active my chart account, they will be released to your MyChart.  If you prefer to have these labs released to you via telephone, please let us know.  If you had a pap smear or biopsy performed, expect to be contacted in about 7-10 days.

## 2020-11-15 NOTE — Progress Notes (Signed)
Subjective: CC: GAD PCP: [name], DO BTD:[id number] Julie Morton is a 35 y.o. female presenting to clinic today for:  1. GAD/ angioedema She has had some intermittent toe swelling when she did not take the Prozac but otherwise has not had any recurrent lip swelling.  Of note she is also going to a less stressful job.  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  She feels her quality of life is much better.  She does report easy fatigability if she misses doses of Prozac.  She finds that she is quite drowsy on the days she does not take it  2.  Decreased hearing Patient reports that she has been having difficulty hearing for some time now.  There is apparently a family history of hearing difficulties.  Does not report ringing in her ears but she finds it hard to discern lower tones, particularly when her husband is speaking to her.  She often will keep the phone on speaker so that she can hear or increase her television volume in order to compensate for hearing loss   ROS: Per HPI  Allergies  Allergen Reactions   Amoxicillin Hives and Other (See Comments)    Has patient had a PCN reaction causing immediate rash, facial/tongue/throat swelling, SOB or lightheadedness with hypotension: No Has patient had a PCN reaction causing severe rash involving mucus membranes or skin necrosis: No Has patient had a PCN reaction that required hospitalization No Has patient had a PCN reaction occurring within the last 10 years: Yes If all of the above answers are "NO", then may proceed with Cephalosporin use.    Penicillins Hives and Other (See Comments)    Has patient had a PCN reaction causing immediate rash, facial/tongue/throat swelling, SOB or lightheadedness with hypotension: No Has patient had a PCN reaction causing severe rash involving mucus membranes or skin necrosis: No Has patient had a PCN reaction that required hospitalization No Has patient had a PCN  reaction occurring within the last 10 years: Yes If all of the above answers are "NO", then may proceed with Cephalosporin use.   Past Medical History:  Diagnosis Date   Medical history non-contributory     Current Outpatient Medications:    clindamycin (CLEOCIN) 300 MG capsule, Take 1 capsule (300 mg total) by mouth 3 (three) times daily., Disp: 30 capsule, Rfl: 0   EPINEPHrine (EPIPEN 2-PAK) 0.3 mg/0.3 mL IJ SOAJ injection, Inject 0.3 mLs (0.3 mg total) into the muscle as needed for anaphylaxis. Then go to ER, Disp: 1 each, Rfl: 0   FLUoxetine (PROZAC) 40 MG capsule, Take 1 capsule (40 mg total) by mouth daily. (Needs to be seen before next refill), Disp: 30 capsule, Rfl: 0   hydrOXYzine (ATARAX/VISTARIL) 50 MG tablet, Take 0.5-1 tablets (25-50 mg total) by mouth 3 (three) times daily as needed ((allergic reaction))., Disp: 30 tablet, Rfl: 0   levonorgestrel (MIRENA, 52 MG,) 20 MCG/24HR IUD, Mirena 20 mcg/24 hours (5 yrs) 52 mg intrauterine device  Take 1 device by intrauterine route., Disp: , Rfl:    loratadine (CLARITIN) 10 MG tablet, Take 10 mg by mouth daily as needed for allergies., Disp: , Rfl:  Social History   Socioeconomic History   Marital status: Married    Spouse name: Not on file   Number of children: Not on file   Years of education: Not on file   Highest education level: Not on file  Occupational History  Not on file  Tobacco Use   Smoking status: Current Every Day Smoker    Packs/day: 0.50    Years: 10.00    Pack years: 5.00    Types: Cigarettes   Smokeless tobacco: Never Used  Substance and Sexual Activity   Alcohol use: No   Drug use: No   Sexual activity: Yes  Other Topics Concern   Not on file  Social History Narrative   Not on file   Social Determinants of Health   Financial Resource Strain: Not on file  Food Insecurity: Not on file  Transportation Needs: Not on file  Physical Activity: Not on file  Stress: Not on file  Social  Connections: Not on file  Intimate Partner Violence: Not on file   Family History  Problem Relation Age of Onset   Cervical cancer Maternal Grandmother    Cancer Maternal Grandmother     Objective: Office vital signs reviewed. BP 110/71    Pulse 98    Temp (!) 97.1 F (36.2 C) (Temporal)    Ht 4' 11"  (1.499 m)    Wt 158 lb (71.7 kg)    SpO2 99%    BMI 31.91 kg/m   Physical Examination:  General: Awake, alert, well nourished, No acute distress HEENT: Normal; no goiter.  No exophthalmos.  TMs intact bilaterally.  No bulging.  No fluid appreciated behind the tympanic membrane Cardio: regular rate and rhythm, S1S2 heard, no murmurs appreciated Pulm: clear to auscultation bilaterally, no wheezes, rhonchi or rales; normal work of breathing on room air GI: soft, non-tender, non-distended, bowel sounds present x4, no hepatomegaly, no splenomegaly, no masses Neuro: No tremor Psych: Mood stable, speech normal Depression screen [facility] 2/9 11/15/2020 04/04/2019 11/02/2017  Decreased Interest 0 0 0  Down, Depressed, Hopeless 0 0 0  PHQ - 2 Score 0 0 0  Altered sleeping 0 0 -  Tired, decreased energy 0 0 -  Change in appetite 0 0 -  Feeling bad or failure about yourself  0 0 -  Trouble concentrating 0 0 -  Moving slowly or fidgety/restless 0 0 -  Suicidal thoughts 0 0 -  PHQ-9 Score 0 0 -   No flowsheet data found.     Assessment/ Plan: 35 y.o. female   Generalized anxiety disorder - Plan: CMP14+EGFR, FLUoxetine (PROZAC) 20 MG capsule  Lip swelling - Plan: FLUoxetine (PROZAC) 20 MG capsule  Fatigue, unspecified type - Plan: Vitamin B12, CBC, TSH  Hearing difficulty of both ears - Plan: Ambulatory referral to Audiology  Stable.  Reduce to 20 mg of fluoxetine.  She will contact me if we need to increase again  Given her decreased energy we will check vitamin B12, CBC and TSH  I have referred her to audiology for decreased hearing  No orders of the defined types were placed in  this encounter.  No orders of the defined types were placed in this encounter.  She will schedule OB/GYN appointment for Pap smear  [name], [place] [phone]

## 2020-11-16 ENCOUNTER — Encounter: Payer: Self-pay | Admitting: Family Medicine

## 2020-11-16 LAB — CBC
Hematocrit: 41 % (ref 34.0–46.6)
Hemoglobin: 13.7 g/dL (ref 11.1–15.9)
MCH: 30.2 pg (ref 26.6–33.0)
MCHC: 33.4 g/dL (ref 31.5–35.7)
MCV: 90 fL (ref 79–97)
Platelets: 383 10*3/uL (ref 150–450)
RBC: 4.54 x10E6/uL (ref 3.77–5.28)
RDW: 12.4 % (ref 11.7–15.4)
WBC: 6.2 10*3/uL (ref 3.4–10.8)

## 2020-11-16 LAB — CMP14+EGFR
ALT: 16 IU/L (ref 0–32)
AST: 21 IU/L (ref 0–40)
Albumin/Globulin Ratio: 2 (ref 1.2–2.2)
Albumin: 4.5 g/dL (ref 3.8–4.8)
Alkaline Phosphatase: 63 IU/L (ref 44–121)
BUN/Creatinine Ratio: 8 — ABNORMAL LOW (ref 9–23)
BUN: 6 mg/dL (ref 6–20)
Bilirubin Total: 0.5 mg/dL (ref 0.0–1.2)
CO2: 20 mmol/L (ref 20–29)
Calcium: 9.7 mg/dL (ref 8.7–10.2)
Chloride: 104 mmol/L (ref 96–106)
Creatinine, Ser: 0.74 mg/dL (ref 0.57–1.00)
GFR calc Af Amer: 122 mL/min/{1.73_m2} (ref >59)
GFR calc non Af Amer: 106 mL/min/{1.73_m2} (ref >59)
Globulin, Total: 2.3 g/dL (ref 1.5–4.5)
Glucose: 84 mg/dL (ref 65–99)
Potassium: 4.3 mmol/L (ref 3.5–5.2)
Sodium: 139 mmol/L (ref 134–144)
Total Protein: 6.8 g/dL (ref 6.0–8.5)

## 2020-11-16 LAB — VITAMIN B12: Vitamin B-12: 935 pg/mL (ref 232–1245)

## 2020-11-16 LAB — TSH: TSH: 1.11 u[IU]/mL (ref 0.450–4.500)

## 2020-12-23 ENCOUNTER — Ambulatory Visit: Payer: BLUE CROSS/BLUE SHIELD | Admitting: Audiologist

## 2021-01-14 ENCOUNTER — Ambulatory Visit: Payer: BLUE CROSS/BLUE SHIELD | Attending: Family Medicine | Admitting: Audiologist

## 2021-03-27 ENCOUNTER — Ambulatory Visit (INDEPENDENT_AMBULATORY_CARE_PROVIDER_SITE_OTHER): Payer: Managed Care, Other (non HMO) | Admitting: Family Medicine

## 2021-03-27 ENCOUNTER — Telehealth: Payer: BLUE CROSS/BLUE SHIELD | Admitting: Physician Assistant

## 2021-03-27 ENCOUNTER — Encounter: Payer: Self-pay | Admitting: Family Medicine

## 2021-03-27 DIAGNOSIS — J019 Acute sinusitis, unspecified: Secondary | ICD-10-CM

## 2021-03-27 DIAGNOSIS — B9789 Other viral agents as the cause of diseases classified elsewhere: Secondary | ICD-10-CM

## 2021-03-27 DIAGNOSIS — J069 Acute upper respiratory infection, unspecified: Secondary | ICD-10-CM

## 2021-03-27 MED ORDER — FLUTICASONE PROPIONATE 50 MCG/ACT NA SUSP: 2.0000 | Freq: Every day | NASAL | 6 refills | Status: DC

## 2021-03-27 MED ORDER — CHLORPHEN-PE-ACETAMINOPHEN 4-10-325 MG PO TABS: 1.0000 | ORAL_TABLET | Freq: Four times a day (QID) | ORAL | 0 refills | Status: DC | PRN

## 2021-03-27 NOTE — Progress Notes (Signed)
   Virtual Visit  Note Due to COVID-19 pandemic this visit was conducted virtually. This visit type was conducted due to national recommendations for restrictions regarding the COVID-19 Pandemic (e.g. social distancing, sheltering in place) in an effort to limit this patient's exposure and mitigate transmission in our community. All issues noted in this document were discussed and addressed.  A physical exam was not performed with this format.  I connected with Julie Morton on 03/27/21 at 1119 by telephone and verified that I am speaking with the correct person using two identifiers. Julie Morton is currently located at work and no one is currently with her during the visit. The provider, Gabriel Earing, FNP is located in their office at time of visit.  I discussed the limitations, risks, security and privacy concerns of performing an evaluation and management service by telephone and the availability of in person appointments. I also discussed with the patient that there may be a patient responsible charge related to this service. The patient expressed understanding and agreed to proceed.  CC: sore throat  History and Present Illness:  HPI Baylyn reports sore throat, HA, and nasal congestion x 2 days. She denies fever, body aches, chills, cough, nausea, vomiting, diarrhea, chest pain, or shortness of breath. She has been exposed to Covid at work but did have a negative Covid test yesterday. She has tried sudafed without much improvement. She reports a history of sinus infections that require treatment with antibiotics.     ROS As per HPI.   Observations/Objective: Alert and oriented x 3. Able to speak in full sentences without difficulty.    Assessment and Plan: Diagnoses and all orders for this visit:  Viral URI Symptoms x 2 days. Negative Covid test yesterday. Discussed that antibiotic are not warranted at this time. Care is focused on symptomatic treatment for viral illness.  Norel AD ordered. Discussed nasal saline, hydration, rest. Return to office for new or worsening symptoms, or if symptoms persist.  -     Chlorphen-PE-Acetaminophen 4-10-325 MG TABS; Take 1 tablet by mouth every 6 (six) hours as needed.    Follow Up Instructions: As needed.     I discussed the assessment and treatment plan with the patient. The patient was provided an opportunity to ask questions and all were answered. The patient agreed with the plan and demonstrated an understanding of the instructions.   The patient was advised to call back or seek an in-person evaluation if the symptoms worsen or if the condition fails to improve as anticipated.  The above assessment and management plan was discussed with the patient. The patient verbalized understanding of and has agreed to the management plan. Patient is aware to call the clinic if symptoms persist or worsen. Patient is aware when to return to the clinic for a follow-up visit. Patient educated on when it is appropriate to go to the emergency department.   Time call ended: 1131  I provided 12 minutes of  non face-to-face time during this encounter.    Gabriel Earing, FNP

## 2021-03-27 NOTE — Progress Notes (Signed)
E-Visit for Sinus Problems  We are sorry that you are not feeling well.  Here is how we plan to help!  Based on what you have shared with me it looks like you have sinusitis.  Sinusitis is inflammation and infection in the sinus cavities of the head.  Based on your presentation I believe you most likely have Acute Viral Sinusitis.This is an infection most likely caused by a virus. There is not specific treatment for viral sinusitis other than to help you with the symptoms until the infection runs its course.  You may use an oral decongestant such as Mucinex D or if you have glaucoma or high blood pressure use plain Mucinex. Saline nasal spray help and can safely be used as often as needed for congestion, I have prescribed: Fluticasone nasal spray two sprays in each nostril once a day. You can also take an over the counter antihistamine like claritin, zyrtec, allegra, or xyzal.  Some authorities believe that zinc sprays, like Zycam, or the use of Echinacea may shorten the course of your symptoms.  Sinus infections are not as easily transmitted as other respiratory infection, however we still recommend that you avoid close contact with loved ones, especially the very young and elderly.  Remember to wash your hands thoroughly throughout the day as this is the number one way to prevent the spread of infection!  Home Care: Only take medications as instructed by your medical team. Do not take these medications with alcohol. A steam or ultrasonic humidifier can help congestion.  You can place a towel over your head and breathe in the steam from hot water coming from a faucet. Avoid close contacts especially the very young and the elderly. Cover your mouth when you cough or sneeze. Always remember to wash your hands.  Get Help Right Away If: You develop worsening fever or sinus pain. You develop a severe head ache or visual changes. Your symptoms persist after you have completed your treatment  plan.  Make sure you Understand these instructions. Will watch your condition. Will get help right away if you are not doing well or get worse.   Thank you for choosing an e-visit.  Your e-visit answers were reviewed by a board certified advanced clinical practitioner to complete your personal care plan. Depending upon the condition, your plan could have included both over the counter or prescription medications.  Please review your pharmacy choice. Make sure the pharmacy is open so you can pick up prescription now. If there is a problem, you may contact your provider through [employer] and have the prescription routed to another pharmacy.  Your safety is important to [place]. If you have drug allergies check your prescription carefully.   For the next 24 hours you can use MyChart to ask questions about today's visit, request a non-urgent call back, or ask for a work or school excuse. You will get an email in the next two days asking about your experience. I hope that your e-visit has been valuable and will speed your recovery.  I provided 5 minutes of non face-to-face time during this encounter for chart review and documentation.

## 2021-04-03 ENCOUNTER — Telehealth: Payer: Managed Care, Other (non HMO) | Admitting: Orthopedic Surgery

## 2021-04-03 DIAGNOSIS — J029 Acute pharyngitis, unspecified: Secondary | ICD-10-CM | POA: Diagnosis not present

## 2021-04-03 MED ORDER — AZITHROMYCIN 250 MG PO TABS
ORAL_TABLET | ORAL | 0 refills | Status: DC
Start: 2021-04-03 — End: 2021-05-31

## 2021-04-03 NOTE — Progress Notes (Signed)
E-Visit for Sore Throat - Strep Symptoms  We are sorry that you are not feeling well.  Here is how we plan to help!  Consider testing for Covid again. Negative test results are unfortunately not very reliable.  Based on what you have shared with me it is likely that you have strep pharyngitis.  Strep pharyngitis is inflammation and infection in the back of the throat.  This is an infection cause by bacteria and is treated with antibiotics.  I have prescribed Azithromycin 250 mg two tablets today and then one daily for 4 additional days. For throat pain, we recommend over the counter oral pain relief medications such as acetaminophen or aspirin, or anti-inflammatory medications such as ibuprofen or naproxen sodium. Topical treatments such as oral throat lozenges or sprays may be used as needed. Strep infections are not as easily transmitted as other respiratory infections, however we still recommend that you avoid close contact with loved ones, especially the very young and elderly.  Remember to wash your hands thoroughly throughout the day as this is the number one way to prevent the spread of infection and wipe down door knobs and counters with disinfectant.   Home Care: Only take medications as instructed by your medical team. Complete the entire course of an antibiotic. Do not take these medications with alcohol. A steam or ultrasonic humidifier can help congestion.  You can place a towel over your head and breathe in the steam from hot water coming from a faucet. Avoid close contacts especially the very young and the elderly. Cover your mouth when you cough or sneeze. Always remember to wash your hands.  Get Help Right Away If: You develop worsening fever or sinus pain. You develop a severe head ache or visual changes. Your symptoms persist after you have completed your treatment plan.  Make sure you Understand these instructions. Will watch your condition. Will get help right away if  you are not doing well or get worse.   Thank you for choosing an e-visit.  Your e-visit answers were reviewed by a board certified advanced clinical practitioner to complete your personal care plan. Depending upon the condition, your plan could have included both over the counter or prescription medications.  Please review your pharmacy choice. Make sure the pharmacy is open so you can pick up prescription now. If there is a problem, you may contact your provider through [employer] and have the prescription routed to another pharmacy.  Your safety is important to [place]. If you have drug allergies check your prescription carefully.   For the next 24 hours you can use MyChart to ask questions about today's visit, request a non-urgent call back, or ask for a work or school excuse. You will get an email in the next two days asking about your experience. I hope that your e-visit has been valuable and will speed your recovery.   Greater than 5 minutes, yet less than 10 minutes of time have been spent researching, coordinating and implementing care for this patient today.

## 2021-05-31 ENCOUNTER — Telehealth: Payer: BLUE CROSS/BLUE SHIELD | Admitting: Nurse Practitioner

## 2021-05-31 DIAGNOSIS — J069 Acute upper respiratory infection, unspecified: Secondary | ICD-10-CM

## 2021-05-31 MED ORDER — FLUTICASONE PROPIONATE 50 MCG/ACT NA SUSP: 2.0000 | Freq: Every day | NASAL | 6 refills | Status: DC

## 2021-05-31 NOTE — Progress Notes (Signed)
E-Visit for Sinus Problems  We are sorry that you are not feeling well.  Here is how we plan to help!  Based on what you have shared with me it looks like you have sinusitis.  Sinusitis is inflammation and infection in the sinus cavities of the head.  Based on your presentation I believe you most likely have Acute Viral Sinusitis.This is an infection most likely caused by a virus. There is not specific treatment for viral sinusitis other than to help you with the symptoms until the infection runs its course.  You may use an oral decongestant such as Mucinex D or if you have glaucoma or high blood pressure use plain Mucinex. Saline nasal spray help and can safely be used as often as needed for congestion, I have prescribed: Fluticasone nasal spray two sprays in each nostril once a day.  It is not effective to take antibiotics before 7-10 days of nasal congestion symptoms. Currently your symptoms align with a viral infection.   Some authorities believe that zinc sprays or the use of Echinacea may shorten the course of your symptoms.  Sinus infections are not as easily transmitted as other respiratory infection, however we still recommend that you avoid close contact with loved ones, especially the very young and elderly.  Remember to wash your hands thoroughly throughout the day as this is the number one way to prevent the spread of infection!  It is also a good idea to repeat COVID testing today and tomorrow.   Home Care: Only take medications as instructed by your medical team. Do not take these medications with alcohol. A steam or ultrasonic humidifier can help congestion.  You can place a towel over your head and breathe in the steam from hot water coming from a faucet. Avoid close contacts especially the very young and the elderly. Cover your mouth when you cough or sneeze. Always remember to wash your hands.  Get Help Right Away If: You develop worsening fever or sinus pain. You develop a  severe head ache or visual changes. Your symptoms persist after you have completed your treatment plan.  Make sure you Understand these instructions. Will watch your condition. Will get help right away if you are not doing well or get worse.   Thank you for choosing an e-visit.  Your e-visit answers were reviewed by a board certified advanced clinical practitioner to complete your personal care plan. Depending upon the condition, your plan could have included both over the counter or prescription medications.  Please review your pharmacy choice. Make sure the pharmacy is open so you can pick up prescription now. If there is a problem, you may contact your provider through [employer] and have the prescription routed to another pharmacy.  Your safety is important to [place]. If you have drug allergies check your prescription carefully.   For the next 24 hours you can use MyChart to ask questions about today's visit, request a non-urgent call back, or ask for a work or school excuse. You will get an email in the next two days asking about your experience. I hope that your e-visit has been valuable and will speed your recovery. Meds ordered this encounter  Medications   fluticasone (FLONASE) 50 MCG/ACT nasal spray    Sig: Place 2 sprays into both nostrils daily.    Dispense:  16 g    Refill:  6    I spent approximately 7 minutes reviewing the patient's history, current symptoms and coordinating their plan  of care today.

## 2021-11-26 ENCOUNTER — Other Ambulatory Visit: Payer: Self-pay | Admitting: Family Medicine

## 2021-11-26 DIAGNOSIS — F411 Generalized anxiety disorder: Secondary | ICD-10-CM

## 2021-11-26 DIAGNOSIS — R220 Localized swelling, mass and lump, head: Secondary | ICD-10-CM

## 2021-12-02 ENCOUNTER — Other Ambulatory Visit: Payer: Self-pay | Admitting: Family Medicine

## 2021-12-02 ENCOUNTER — Telehealth: Payer: Self-pay | Admitting: Physician Assistant

## 2021-12-02 DIAGNOSIS — R52 Pain, unspecified: Secondary | ICD-10-CM

## 2021-12-02 DIAGNOSIS — F411 Generalized anxiety disorder: Secondary | ICD-10-CM

## 2021-12-02 DIAGNOSIS — R220 Localized swelling, mass and lump, head: Secondary | ICD-10-CM

## 2021-12-02 MED ORDER — CYCLOBENZAPRINE HCL 10 MG PO TABS: 10.0000 mg | ORAL_TABLET | Freq: Three times a day (TID) | ORAL | 0 refills | Status: AC | PRN

## 2021-12-02 MED ORDER — MELOXICAM 15 MG PO TABS: 15.0000 mg | ORAL_TABLET | Freq: Every day | ORAL | 0 refills | Status: AC

## 2021-12-02 NOTE — Progress Notes (Signed)
?Virtual Visit Consent  ? ?Julie Morton, you are scheduled for a virtual visit with a [facility] provider today.   ?  ?Just as with appointments in the office, your consent must be obtained to participate.  Your consent will be active for this visit and any virtual visit you may have with one of our providers in the next 365 days.   ?  ?If you have a MyChart account, a copy of this consent can be sent to you electronically.  All virtual visits are billed to your insurance company just like a traditional visit in the office.   ? ?As this is a virtual visit, video technology does not allow for your provider to perform a traditional examination.  This may limit your provider's ability to fully assess your condition.  If your provider identifies any concerns that need to be evaluated in person or the need to arrange testing (such as labs, EKG, etc.), we will make arrangements to do so.   ?  ?Although advances in technology are sophisticated, we cannot ensure that it will always work on either your end or our end.  If the connection with a video visit is poor, the visit may have to be switched to a telephone visit.  With either a video or telephone visit, we are not always able to ensure that we have a secure connection.    ? ?I need to obtain your verbal consent now.   Are you willing to proceed with your visit today?  ?  ?Julie Morton has provided verbal consent on 12/02/2021 for a virtual visit (video or telephone). ?  ?Julie Climes, PA-C  ? ?Date: 12/02/2021 2:40 PM ? ? ?Virtual Visit via Video Note  ? ?IPiedad Morton, connected with  Julie Morton  ([id number], [id number]) on 12/02/21 at  2:30 PM EDT by a video-enabled telemedicine application and verified that I am speaking with the correct person using two identifiers. ? ?Location: ?Patient: Virtual Visit Location Patient: Home ?Provider: Virtual Visit Location Provider: Home Office ?  ?I discussed the limitations of evaluation and management by  telemedicine and the availability of in person appointments. The patient expressed understanding and agreed to proceed.   ? ?History of Present Illness: ?Julie Morton is a 36 y.o. who identifies as a female who was assigned female at birth, and is being seen today for some pain in her left side starting after lifting some heavy furniture/cleaning. This past week. Notes pain and soreness from L axilla down to the lower left side. Increased soreness with laughing, coughing and deep breaths. Denies any skin changes -- bruising, redness or swelling. Denies radiation of pain into extremities. Has tried heat and Ibuprofen without resolution. She denies previous trauma or injury/fracture.  ? ? ?HPI: HPI  ?Problems:  ?Patient Active Problem List  ? Diagnosis Date Noted  ? Trigger ring finger of left hand 01/10/2019  ? Smokes tobacco daily 11/02/2017  ? Heart palpitations 10/06/2016  ?  ?Allergies:  ?Allergies  ?Allergen Reactions  ? Amoxicillin Hives and Other (See Comments)  ?  Has patient had a PCN reaction causing immediate rash, facial/tongue/throat swelling, SOB or lightheadedness with hypotension: No ?Has patient had a PCN reaction causing severe rash involving mucus membranes or skin necrosis: No ?Has patient had a PCN reaction that required hospitalization No ?Has patient had a PCN reaction occurring within the last 10 years: Yes ?If all of the above answers are "NO", then may proceed  with Cephalosporin use. ?  ? Penicillins Hives and Other (See Comments)  ?  Has patient had a PCN reaction causing immediate rash, facial/tongue/throat swelling, SOB or lightheadedness with hypotension: No ?Has patient had a PCN reaction causing severe rash involving mucus membranes or skin necrosis: No ?Has patient had a PCN reaction that required hospitalization No ?Has patient had a PCN reaction occurring within the last 10 years: Yes ?If all of the above answers are "NO", then may proceed with Cephalosporin use.  ? ?Medications:   ?Current Outpatient Medications:  ?  cyclobenzaprine (FLEXERIL) 10 MG tablet, Take 1 tablet (10 mg total) by mouth 3 (three) times daily as needed for muscle spasms., Disp: 30 tablet, Rfl: 0 ?  meloxicam (MOBIC) 15 MG tablet, Take 1 tablet (15 mg total) by mouth daily., Disp: 30 tablet, Rfl: 0 ?  EPINEPHrine (EPIPEN 2-PAK) 0.3 mg/0.3 mL IJ SOAJ injection, Inject 0.3 mLs (0.3 mg total) into the muscle as needed for anaphylaxis. Then go to ER, Disp: 1 each, Rfl: 0 ?  FLUoxetine (PROZAC) 20 MG capsule, Take 1 capsule (20 mg total) by mouth daily., Disp: 90 capsule, Rfl: 3 ?  levonorgestrel (MIRENA, 52 MG,) 20 MCG/24HR IUD, Mirena 20 mcg/24 hours (5 yrs) 52 mg intrauterine device  Take 1 device by intrauterine route., Disp: , Rfl:  ?  levonorgestrel (MIRENA, 52 MG,) 20 MCG/DAY IUD, Mirena 20 mcg/24 hours (7 yrs) 52 mg intrauterine device  Take 1 device by intrauterine route., Disp: , Rfl:  ? ?Observations/Objective: ?Patient is well-developed, well-nourished in no acute distress.  ?Resting comfortably at home.  ?Head is normocephalic, atraumatic.  ?No labored breathing. ?Speech is clear and coherent with logical content.  ?Patient is alert and oriented at baseline.  ? ?Assessment and Plan: ?1. Pain of left side of body ?- meloxicam (MOBIC) 15 MG tablet; Take 1 tablet (15 mg total) by mouth daily.  Dispense: 30 tablet; Refill: 0 ?- cyclobenzaprine (FLEXERIL) 10 MG tablet; Take 1 tablet (10 mg total) by mouth 3 (three) times daily as needed for muscle spasms.  Dispense: 30 tablet; Refill: 0 ? ?Muscular strain and overuse from moving furniture. Rest. Since no swelling, start heat 10 minutes 3-4 x daily. Rx Meloxicam once daily. Tylenol for breakthrough pain. Will start Flexeril QHS. Can be used up to TID but no driving or operating heavy machinery. In-person follow-up precautions reviewed.  ? ?Follow Up Instructions: ?I discussed the assessment and treatment plan with the patient. The patient was provided an opportunity  to ask questions and all were answered. The patient agreed with the plan and demonstrated an understanding of the instructions.  A copy of instructions were sent to the patient via MyChart unless otherwise noted below.  ? ?The patient was advised to call back or seek an in-person evaluation if the symptoms worsen or if the condition fails to improve as anticipated. ? ?Time:  ?I spent 10 minutes with the patient via telehealth technology discussing the above problems/concerns.   ? ?Julie Climes, PA-C ?

## 2021-12-02 NOTE — Patient Instructions (Signed)
?  [name], thank you for joining Piedad Climes, PA-C for today's virtual visit.  While this provider is not your primary care provider (PCP), if your PCP is located in our provider database this encounter information will be shared with them immediately following your visit. ? ?Consent: ?(Patient) Julie Morton provided verbal consent for this virtual visit at the beginning of the encounter. ? ?Current Medications: ? ?Current Outpatient Medications:  ?  EPINEPHrine (EPIPEN 2-PAK) 0.3 mg/0.3 mL IJ SOAJ injection, Inject 0.3 mLs (0.3 mg total) into the muscle as needed for anaphylaxis. Then go to ER, Disp: 1 each, Rfl: 0 ?  FLUoxetine (PROZAC) 20 MG capsule, Take 1 capsule (20 mg total) by mouth daily., Disp: 90 capsule, Rfl: 3 ?  levonorgestrel (MIRENA, 52 MG,) 20 MCG/24HR IUD, Mirena 20 mcg/24 hours (5 yrs) 52 mg intrauterine device  Take 1 device by intrauterine route., Disp: , Rfl:  ?  levonorgestrel (MIRENA, 52 MG,) 20 MCG/DAY IUD, Mirena 20 mcg/24 hours (7 yrs) 52 mg intrauterine device  Take 1 device by intrauterine route., Disp: , Rfl:   ? ?Medications ordered in this encounter:  ?No orders of the defined types were placed in this encounter. ?  ? ?*If you need refills on other medications prior to your next appointment, please contact your pharmacy* ? ?Follow-Up: ?Call back or seek an in-person evaluation if the symptoms worsen or if the condition fails to improve as anticipated. ? ?Other Instructions ?Avoid heavy lifting, significant pushing and pulling against resistance for the next week.  ?Continue use of heating pad.  ?Start the Meloxicam once daily. You can take OTC Tylenol throughout the day for breakthrough pain.  ?Start the Flexeril, taking at night. You can take up to three times daily but no driving or operating heavy machinery when taking during the day. ?Things should start to improve over next few days and continue until resolved. ? ?If not -- you need an in-person evaluation.   ? ?If you have been instructed to have an in-person evaluation today at a local Urgent Care facility, please use the link below. It will take you to a list of all of our available [facility], including address, phone number and hours of operation. Please do not delay care.  ?[facility] ? ?If you or a family member do not have a primary care provider, use the link below to schedule a visit and establish care. When you choose a [facility] primary care physician or advanced practice provider, you gain a long-term partner in health. ?Find a Primary Care Provider ? ?Learn more about [facility] in-office and virtual care options: ?[facility] Get Care Now  ?
